# Patient Record
Sex: Male | Born: 1940 | Race: White | Hispanic: No | Marital: Married | Smoking: Former smoker
Health system: Southern US, Community
[De-identification: ages and names within clinical notes are randomized; demographics above are authoritative.]

## PROBLEM LIST (undated history)

## (undated) DIAGNOSIS — M19041 Primary osteoarthritis, right hand: Secondary | ICD-10-CM

## (undated) DIAGNOSIS — I2510 Atherosclerotic heart disease of native coronary artery without angina pectoris: Secondary | ICD-10-CM

## (undated) DIAGNOSIS — C4490 Unspecified malignant neoplasm of skin, unspecified: Secondary | ICD-10-CM

## (undated) DIAGNOSIS — J189 Pneumonia, unspecified organism: Secondary | ICD-10-CM

## (undated) DIAGNOSIS — M109 Gout, unspecified: Secondary | ICD-10-CM

## (undated) DIAGNOSIS — E785 Hyperlipidemia, unspecified: Secondary | ICD-10-CM

## (undated) DIAGNOSIS — I10 Essential (primary) hypertension: Secondary | ICD-10-CM

## (undated) DIAGNOSIS — I219 Acute myocardial infarction, unspecified: Secondary | ICD-10-CM

## (undated) DIAGNOSIS — M19042 Primary osteoarthritis, left hand: Secondary | ICD-10-CM

## (undated) HISTORY — PX: HERNIA REPAIR: SHX51

## (undated) HISTORY — DX: Essential (primary) hypertension: I10

## (undated) HISTORY — PX: TONSILLECTOMY: SUR1361

## (undated) HISTORY — PX: COLONOSCOPY: SHX174

## (undated) HISTORY — DX: Hyperlipidemia, unspecified: E78.5

## (undated) HISTORY — DX: Atherosclerotic heart disease of native coronary artery without angina pectoris: I25.10

## (undated) HISTORY — PX: CARDIAC CATHETERIZATION: SHX172

## (undated) HISTORY — PX: APPENDECTOMY: SHX54

---

## 1983-10-11 DIAGNOSIS — J189 Pneumonia, unspecified organism: Secondary | ICD-10-CM

## 1983-10-11 HISTORY — DX: Pneumonia, unspecified organism: J18.9

## 2007-04-10 DIAGNOSIS — I219 Acute myocardial infarction, unspecified: Secondary | ICD-10-CM

## 2007-04-10 HISTORY — PX: CORONARY ARTERY BYPASS GRAFT: SHX141

## 2007-04-10 HISTORY — PX: LAPAROSCOPIC INCISIONAL / UMBILICAL / VENTRAL HERNIA REPAIR: SUR789

## 2007-04-10 HISTORY — DX: Acute myocardial infarction, unspecified: I21.9

## 2007-04-16 ENCOUNTER — Ambulatory Visit: Payer: Self-pay | Admitting: Cardiology

## 2007-04-16 ENCOUNTER — Inpatient Hospital Stay (HOSPITAL_COMMUNITY): Admission: AD | Admit: 2007-04-16 | Discharge: 2007-04-26 | Payer: Self-pay | Admitting: Cardiology

## 2007-04-16 ENCOUNTER — Ambulatory Visit: Payer: Self-pay | Admitting: Cardiovascular Disease

## 2007-04-17 ENCOUNTER — Encounter: Payer: Self-pay | Admitting: Cardiovascular Disease

## 2007-04-17 ENCOUNTER — Ambulatory Visit: Payer: Self-pay | Admitting: Thoracic Surgery (Cardiothoracic Vascular Surgery)

## 2007-04-18 ENCOUNTER — Ambulatory Visit: Payer: Self-pay | Admitting: Vascular Surgery

## 2007-04-18 ENCOUNTER — Encounter: Payer: Self-pay | Admitting: Cardiology

## 2007-04-18 ENCOUNTER — Encounter: Payer: Self-pay | Admitting: Thoracic Surgery (Cardiothoracic Vascular Surgery)

## 2007-04-20 ENCOUNTER — Encounter: Payer: Self-pay | Admitting: Thoracic Surgery (Cardiothoracic Vascular Surgery)

## 2007-05-14 ENCOUNTER — Encounter
Admission: RE | Admit: 2007-05-14 | Discharge: 2007-05-14 | Payer: Self-pay | Admitting: Thoracic Surgery (Cardiothoracic Vascular Surgery)

## 2007-05-16 ENCOUNTER — Ambulatory Visit: Payer: Self-pay | Admitting: Thoracic Surgery (Cardiothoracic Vascular Surgery)

## 2007-05-25 ENCOUNTER — Ambulatory Visit: Payer: Self-pay | Admitting: Thoracic Surgery (Cardiothoracic Vascular Surgery)

## 2007-07-18 ENCOUNTER — Ambulatory Visit: Payer: Self-pay | Admitting: Cardiovascular Disease

## 2007-08-17 ENCOUNTER — Ambulatory Visit: Payer: Self-pay | Admitting: Cardiovascular Disease

## 2008-01-21 ENCOUNTER — Ambulatory Visit: Payer: Self-pay | Admitting: Cardiovascular Disease

## 2008-01-23 ENCOUNTER — Encounter: Payer: Self-pay | Admitting: Cardiovascular Disease

## 2008-01-23 ENCOUNTER — Ambulatory Visit (HOSPITAL_COMMUNITY): Admission: RE | Admit: 2008-01-23 | Discharge: 2008-01-23 | Payer: Self-pay | Admitting: Cardiovascular Disease

## 2008-01-23 ENCOUNTER — Ambulatory Visit: Payer: Self-pay | Admitting: Cardiovascular Disease

## 2008-04-07 IMAGING — CR DG CHEST 1V PORT
1 series · 1 of 1 positions shown · non-contrast
Comparison: Preoperative film from 04/18/2007

CLINICAL DATA: Chest pain and shortness of breath. CABG.

[view not recorded]
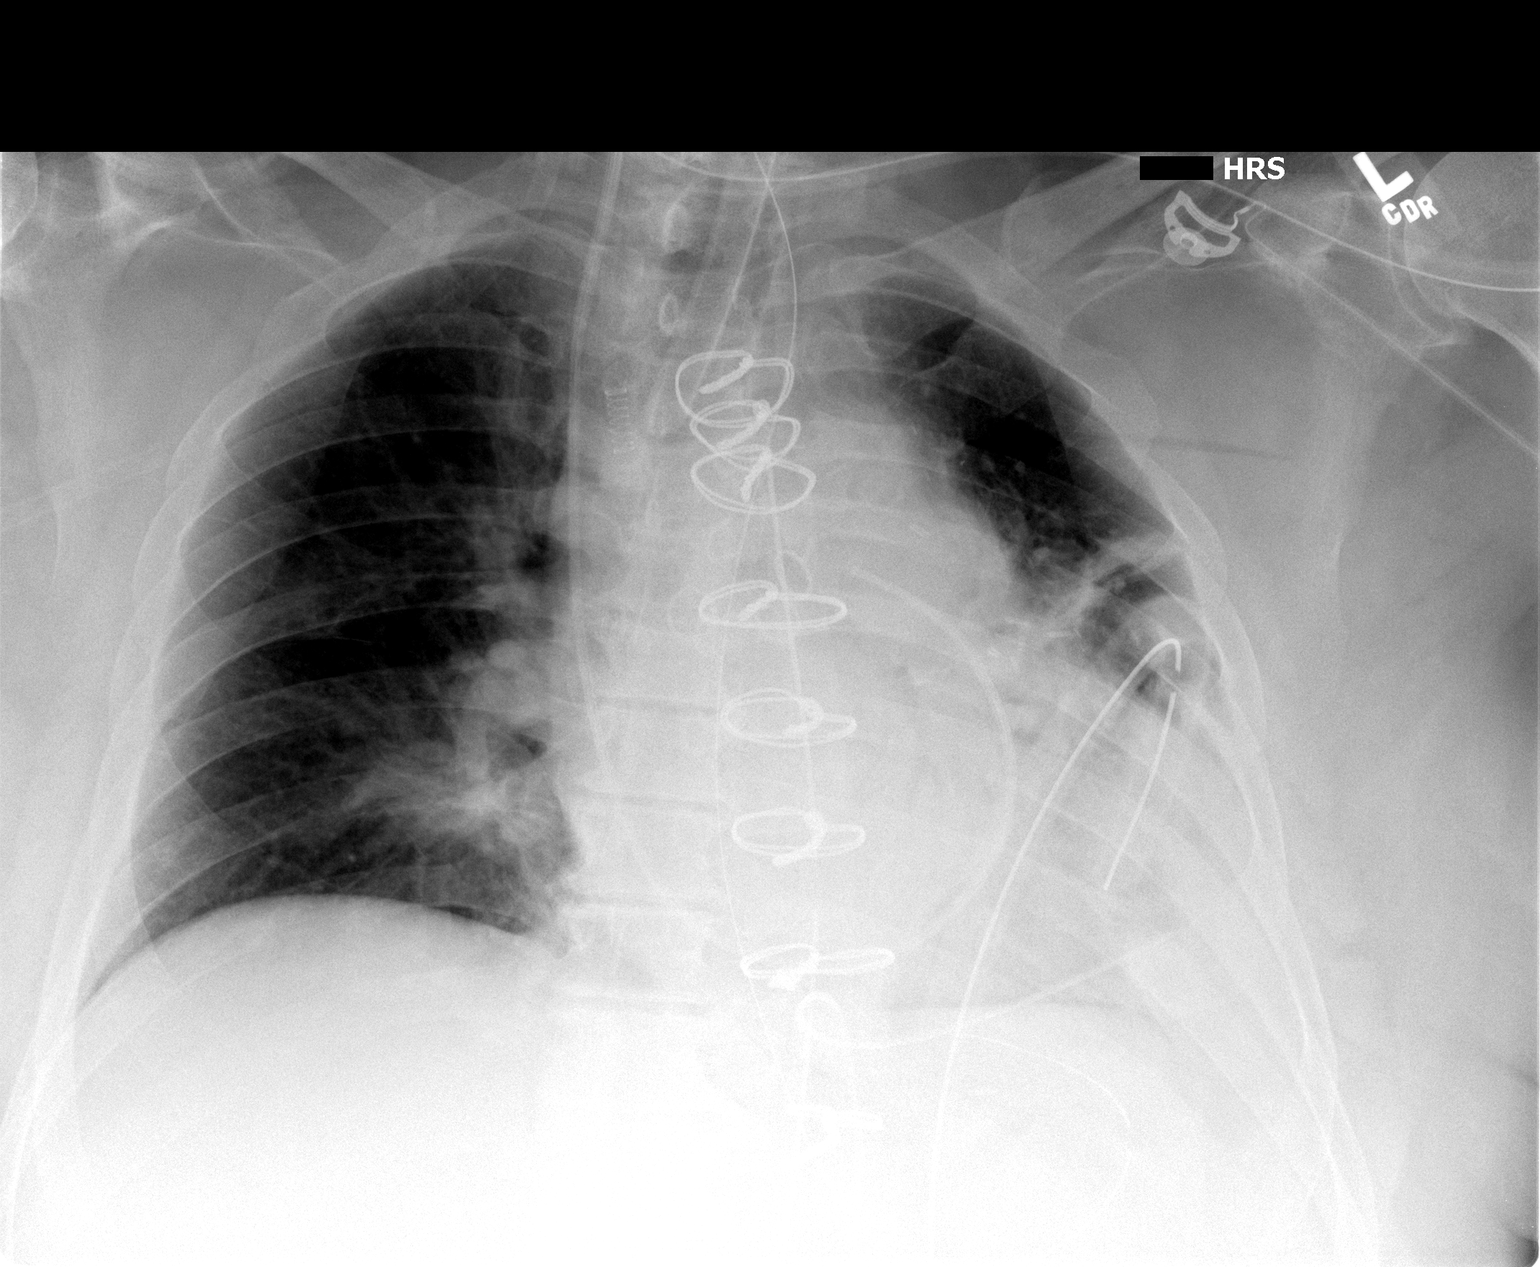

[1 of 1 positions shown; findings below may reference images not displayed]

PORTABLE CHEST - 1 VIEW:

2444 hours. Endotracheal tube tip is 3.0 cm above the base of the carina. Right
IJ pulmonary artery catheter tip is in the right main pulmonary artery. Left
chest tube noted without evidence for pneumothorax. There are 2 mediastinal /
pericardial drains. An NG tube passes into the stomach with the tip overlying
the fundus.

Cardiopericardial silhouette is enlarged. The cardiomediastinal contours are
obscured, raising the question mediastinal hematoma. There is vascular
congestion with bibasilar atelectasis, left greater than right.
IMPRESSION: Support apparatus as described.

Obscuration of cardiomediastinal contours raises concern for mediastinal
hematoma. Please correlate clinically and serial chest x-rays would be helpful
to exclude progression.

Vascular congestion with bibasilar atelectasis, left greater than right.

## 2008-04-08 IMAGING — CR DG CHEST 1V PORT
1 series · 1 of 1 positions shown · non-contrast
Comparison: 04/20/2007

CLINICAL DATA: Chest pain and shortness of breath

[view not recorded]
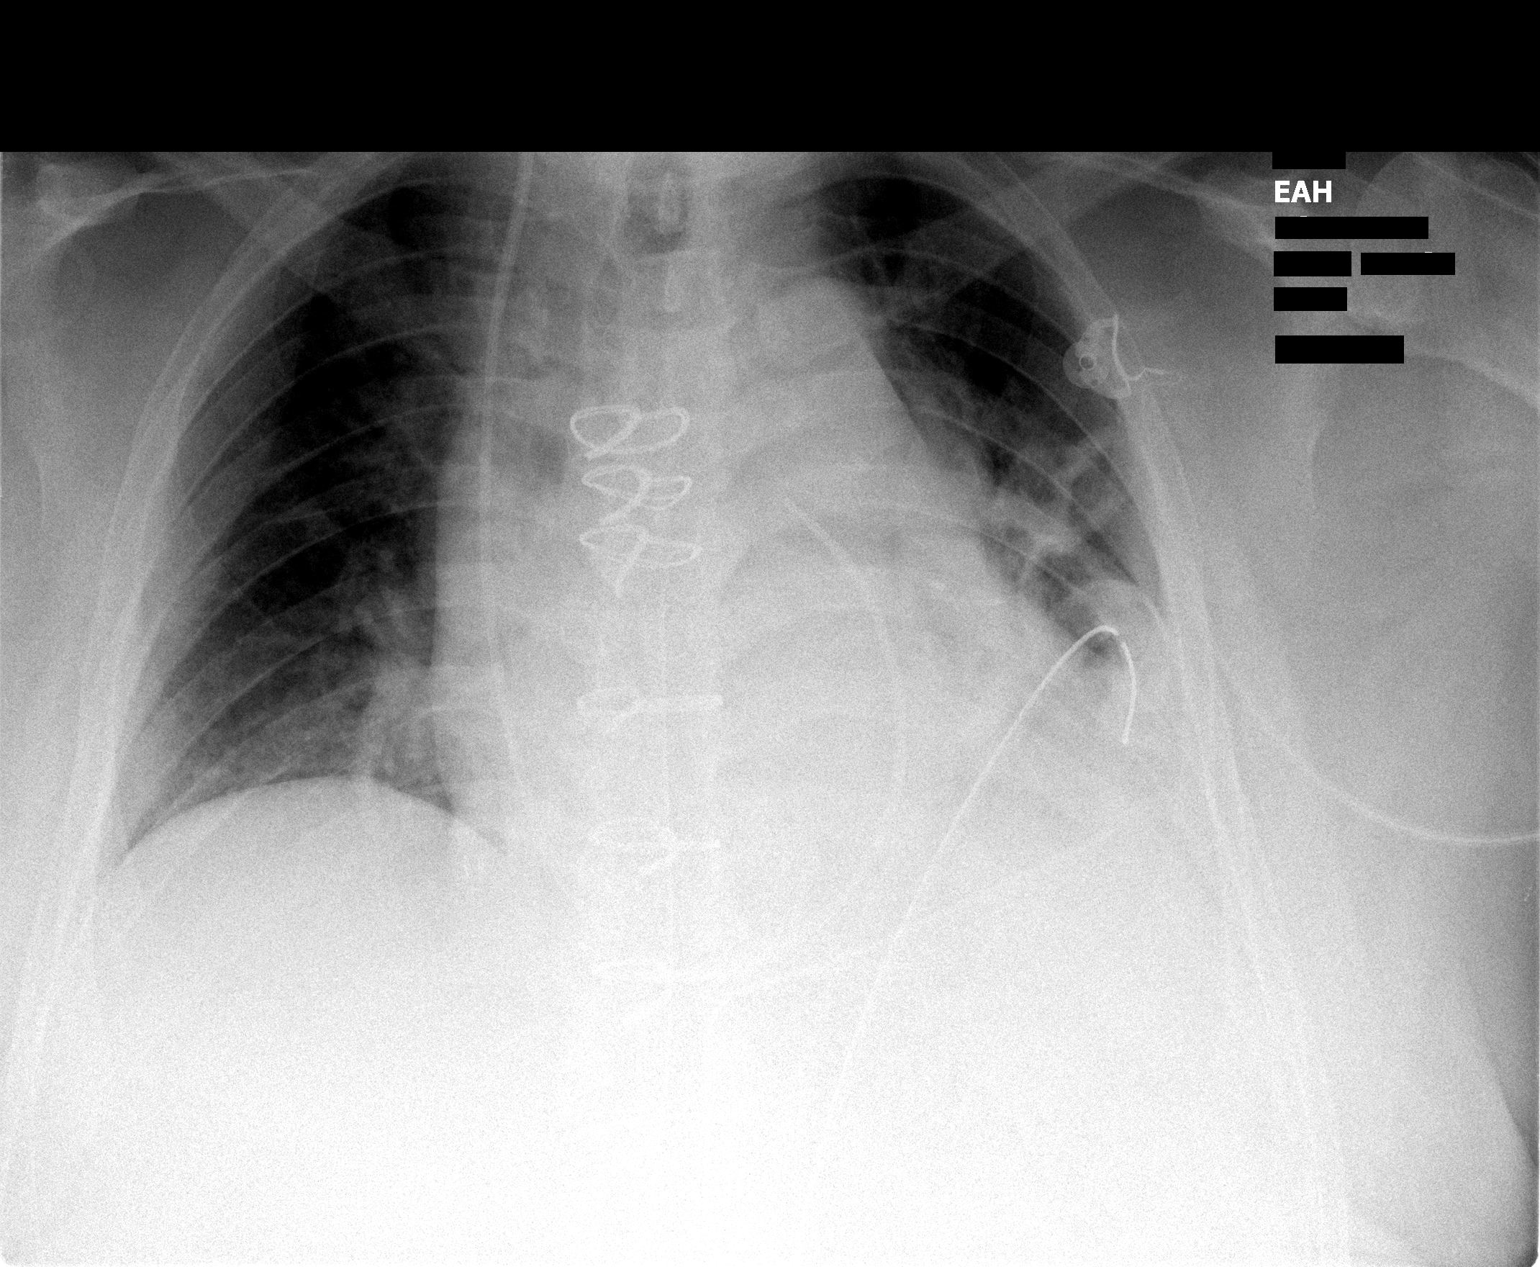

[1 of 1 positions shown; findings below may reference images not displayed]

PORTABLE CHEST - 1 VIEW:

6256 hours. Interval extubation and removal of NG tube. Lung volumes are low
with slight interval increase in left base atelectasis. There is a probable
small left pleural effusion. Left chest tube again noted without evidence for
left pneumothorax. The right IJ pulmonary artery catheter tip is in the main
pulmonary outflow tract to the right main pulmonary artery.  The 2 mediastinal /
pericardial drains are again noted.

Stable widening of the mediastinum with obscuration of mediastinal contours.
IMPRESSION: Stable widening of the mediastinum.

Interval extubation and removal of NG tube.

Interval increase in left base atelectasis.

## 2008-04-09 IMAGING — CR DG CHEST 1V PORT
1 series · 1 of 1 positions shown · non-contrast
Comparison: 04/21/2007
 Portable exam at [DATE] a.m.

CLINICAL DATA: 66-year-old with chest pain, shortness of breath and elevated troponin.   
 PORTABLE CHEST - 04/22/2007:

[view not recorded]
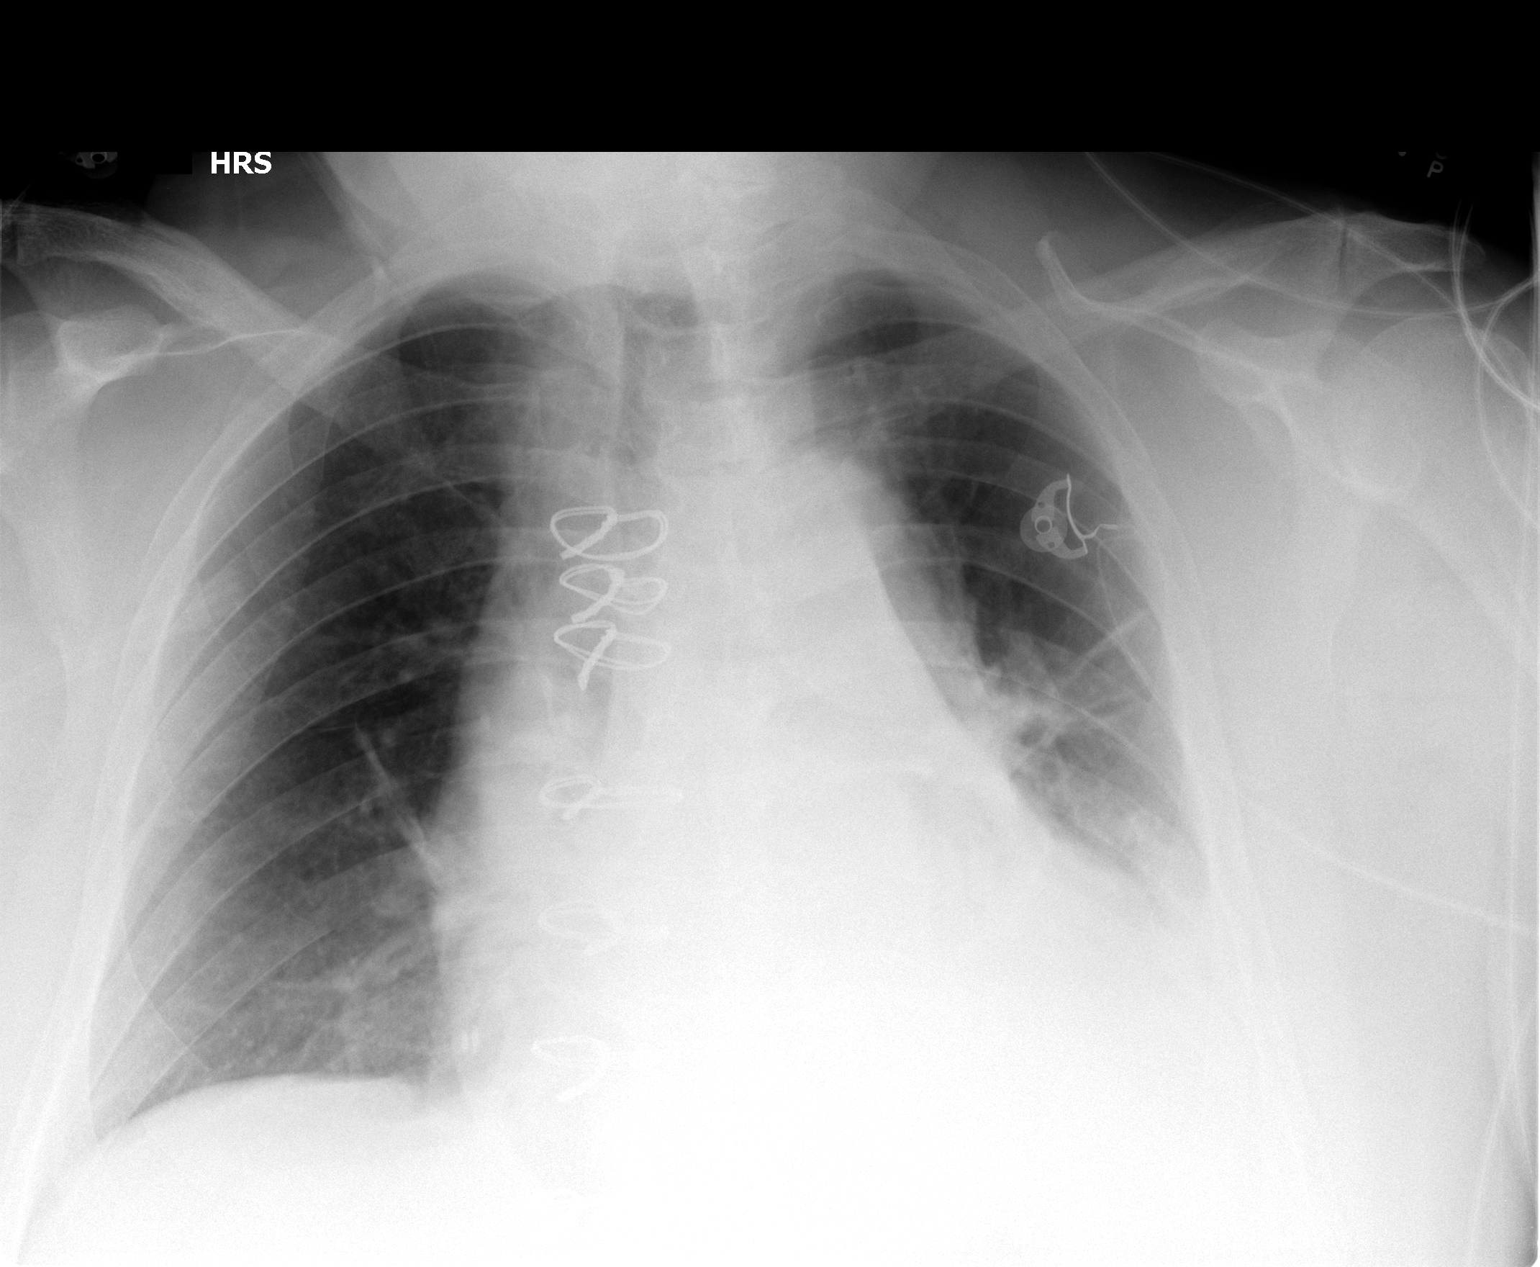

[1 of 1 positions shown; findings below may reference images not displayed]

FINDINGS: Patient has had median sternotomy.  Right IJ Swan-Ganz catheter and left chest tubes have been removed.  There is no evidence for pneumothorax.  There is dense opacification of the left lung base and bilateral perihilar atelectasis.
IMPRESSION: 1.  No evidence for pneumothorax following chest tube removal. 
 2.  Cardiomegaly and bilateral atelectasis.

## 2008-04-10 IMAGING — CR DG CHEST 2V
2 series · 2 of 2 positions shown · non-contrast
Comparison: 04/22/07.

CLINICAL DATA: Chest pain and shortness of breath.
 CHEST - 2 VIEW:

[view not recorded (1 of 2)]
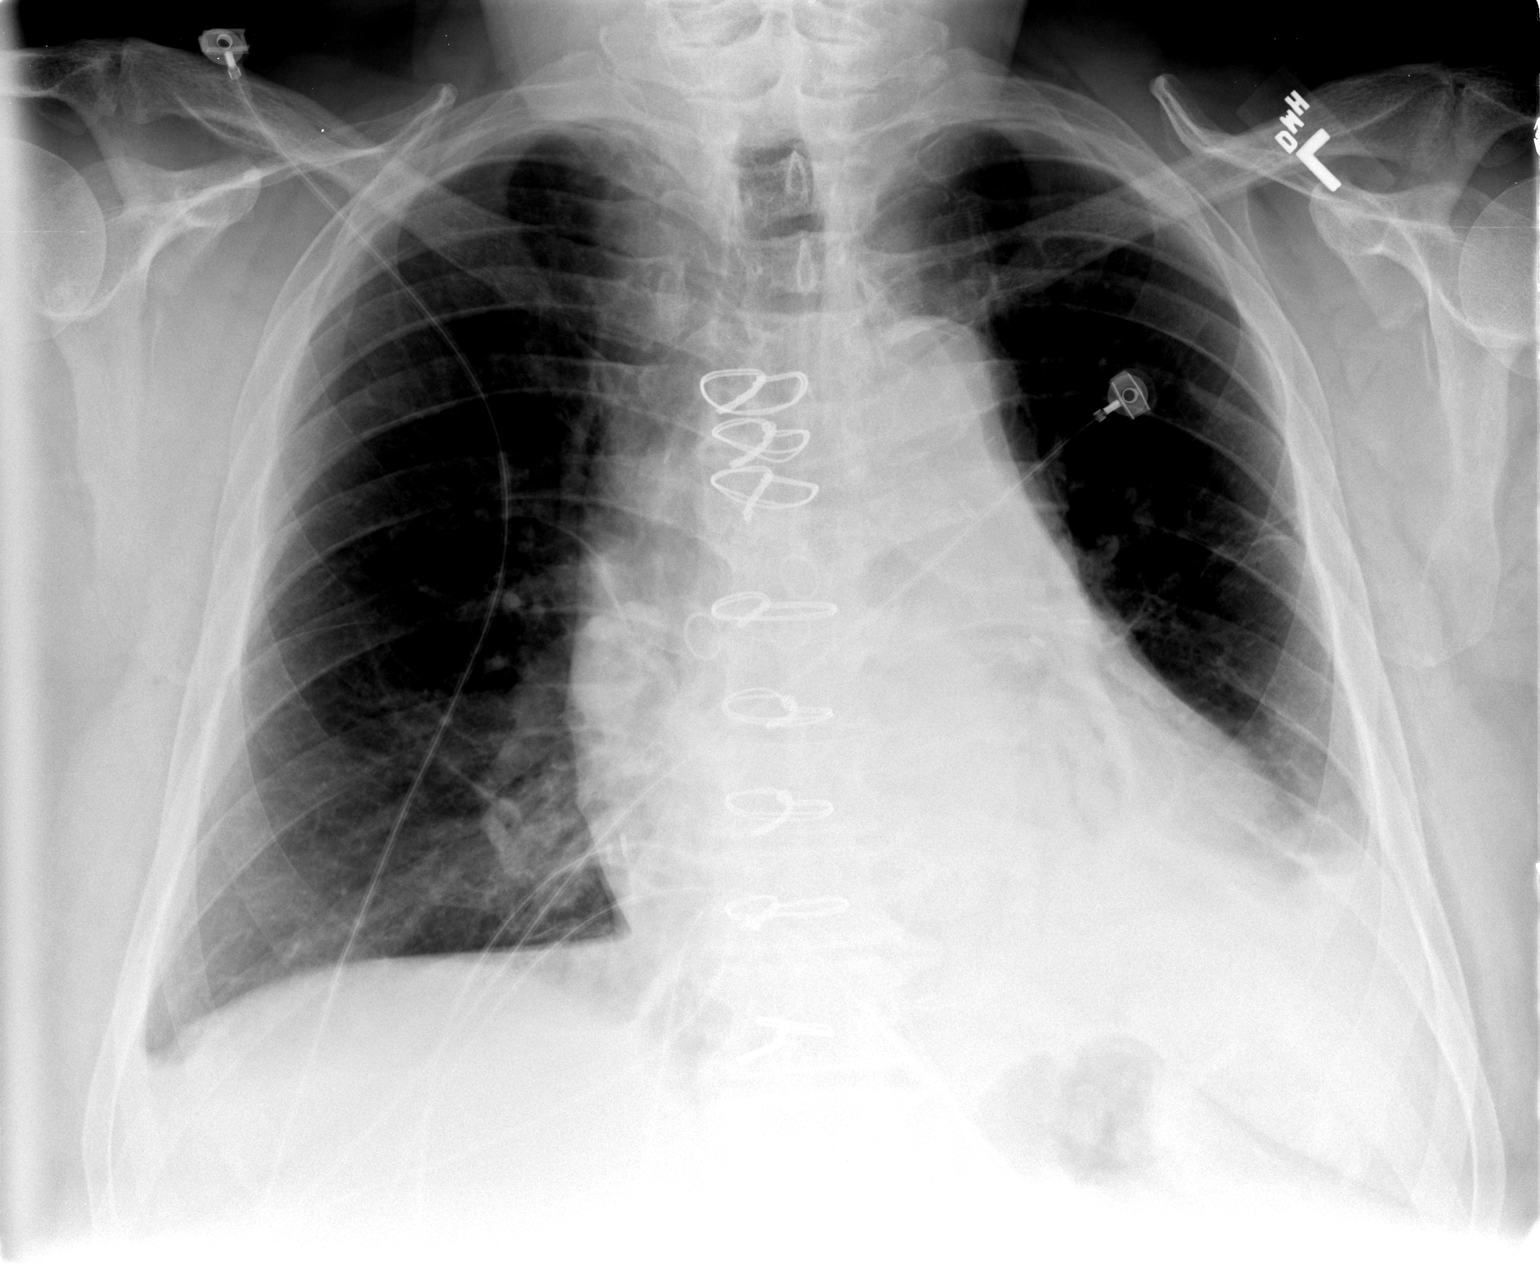

[view not recorded (2 of 2)]
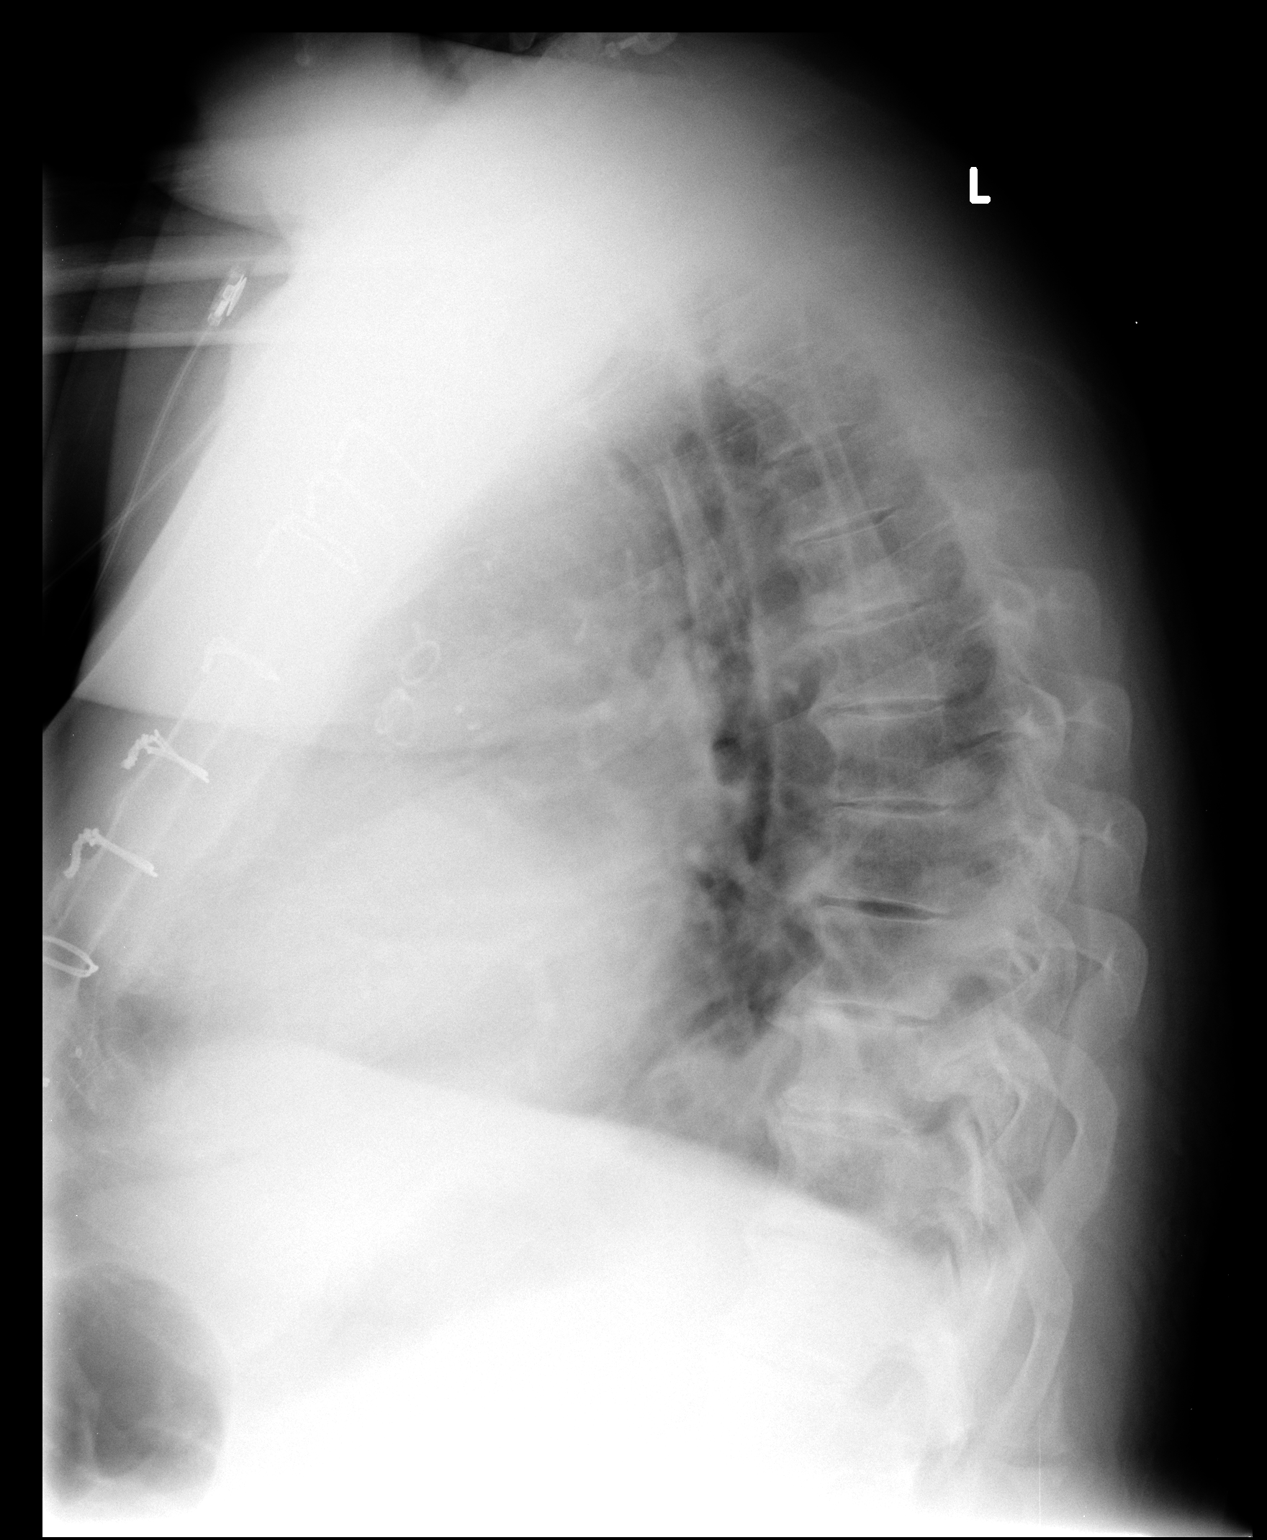

[2 of 2 positions shown; findings below may reference images not displayed]

FINDINGS: Two views of the chest demonstrate cardiac enlargement status post CABG procedure and median sternotomy wires.  There is evidence of bilateral pleural effusions and consolidation in the left lung base, concerning for a focal area of atelectasis versus infection.  No significant pulmonary edema.  The trachea is midline.
IMPRESSION: 1.  Left lower lobe consolidation as described.  Differential includes atelectasis versus pneumonia.
 2.  Small pleural effusions.
 3.  Postoperative changes with cardiomegaly.

## 2009-11-30 ENCOUNTER — Telehealth (INDEPENDENT_AMBULATORY_CARE_PROVIDER_SITE_OTHER): Payer: Self-pay

## 2010-10-31 ENCOUNTER — Encounter: Payer: Self-pay | Admitting: Thoracic Surgery (Cardiothoracic Vascular Surgery)

## 2010-11-10 NOTE — Progress Notes (Signed)
Summary: Pt. overdue for f/u   Phone Note Outgoing Call   Call placed by: Larita Fife Via LPN,  November 30, 2009 4:27 PM Summary of Call: Patient has not been seen in this office since 01-21-08. We received RX refill request for Diovan but Diovan is not listed in his med list. I left message for patient to call this office.  Follow-up for Phone Call        no answer, left message. Follow-up by: Larita Fife Via LPN,  December 02, 2009 4:27 PM  Additional Follow-up for Phone Call Additional follow up Details #1::        Left message with phar. to have pt. call us.

## 2011-02-22 NOTE — Assessment & Plan Note (Signed)
OFFICE VISIT   [name], [name]  DOB:  1941-03-08                                        May 16, 2007  CHART #:  [id number]   HISTORY OF PRESENT ILLNESS:  [name] returns for a routine follow-up  status post coronary artery bypass grafting x 4 on 04/20/07.  His early  postoperative recovery in the hospital was notable for some  postoperative atrial fibrillation.  He was discharged home on amiodarone  in normal sinus rhythm.  Since hospital discharge, [name] has  continued to recover without significant problem.  We did renew his  Lasix prescription over the telephone due to persistent bilateral lower  extremity edema.  He has not yet been seen in follow-up by Dr. Eden Emms  our colleagues at the [facility] office.  He returns to our  office for routine follow-up today.  [name] reports that overall he is  doing very well.  He has minimal residual shortness in his chest and he  is no longer taking any pain medication.  He denies any shortness of  breath.  He denies any exertional chest discomfort worrisome for angina  pectoris.  His appetite  is good and he states that he is maintaining a  heart healthy diet and doing well so far.  He is sleeping well at night.  He does still have bilateral lower extremity edema.  He has been  ambulating reasonably well, and he states that he can walk up to a 1/2  mile at a time at this point.  He has already started driving an  automobile.  He has not yet started the cardiac rehab program.  The  remainder of his review of systems is unrevealing, although [name]  does report that he stopped taking amiodarone on his own 2 weeks ago  because of concerns regarding possible side effects he learned about  while reading on the internet.  His medications otherwise remain  unchanged from the time of hospital discharge.   PHYSICAL EXAMINATION:  Notable for a well-appearing morbidly obese male  with blood pressure 150/92,  pulse 90, oxygen saturation 99% on room air.  HEENT:  Exam is unrevealing.  The chest sternal incision is healing  nicely and the sternum is stable on palpation.  Breath sounds are clear  to auscultation with slightly diminished breath sounds at the left lung  base.  No wheezes or rhonchi are noted.  CARDIOVASCULAR:  Includes regular rate and rhythm.  No murmurs, rubs or  gallops are noted.  ABDOMEN:  Soft, nontender.  EXTREMITIES:  Warm and well-perfused.  The small incision from bilateral  endoscopic vein harvest are healing nicely.  There is moderate bilateral  lower extremity edema at the ankle.  The remainder of his physical exam  is unrevealing.   DIAGNOSTIC TESTS:  Chest x-ray obtained 05/14/07 at the [facility] is reviewed.  This demonstrates clear lung fields  bilaterally with slight persistent elevation of the left hemidiaphragm  with a small left pleural effusion and mild left lower lobe atelectasis.  This appears somewhat improved in comparison with the last x-ray prior  to hospital discharge.  All the sternal wires appear intact.  No other  abnormalities are noted.   IMPRESSION:  Satisfactory progress following recent coronary artery  bypass grafting.  Overall [name]  appears to be doing well.  His blood  pressure has continued to rise following hospital discharge.  He still  has bilateral lower extremity edema and he has a small left pleural  effusion.  Overall he feels well and his activity level has come along  quite nicely.  He stopped taking amiodarone on his own spontaneously two  weeks ago.  He has not had any tachy palpitations.   PLAN:  I have given [name] a prescription for  Lasix 40 mg daily,  potassium 20 mEq daily, and I have also added lisinopril 10 mg daily.  He will continue metoprolol, aspirin and Lipitor as he had been taking  previously.  He has been instructed to contact Dr. Fabio Bering office to  schedule an appointment within the  next few weeks.  I have encouraged  him to continue to gradually increase his physical activity as tolerated  with his only limitation at this point remaining that he refrain from  heavy lifting or strenuous use of his arms or shoulders for at least  another 2 months.  I have encouraged him to go ahead and get started in  the cardiac rehab program.  All of his questions have been addressed.  In the future [name] will call and return to see [place] here at [facility] as necessary.   Salvatore Decent. [name] [name], M.D.  Electronically Signed   CHO/MEDQ  D:  05/16/2007  T:  05/17/2007  Job:  [id number]   cc:   Noralyn Pick. Eden Emms, MD, [facility]  Veverly Fells. Excell Seltzer, MD  [facility] Sherril Croon

## 2011-02-22 NOTE — Op Note (Signed)
[name], [name]                 ACCOUNT NO.:  [id number]   MEDICAL RECORD NO.:  [id number]          PATIENT TYPE:  INP   LOCATION:  2399                         FACILITY:  [facility]   PHYSICIAN:  Salvatore Decent. Cornelius Moras, M.D. DATE OF BIRTH:  12-08-1940   DATE OF PROCEDURE:  04/20/2007  DATE OF DISCHARGE:                               OPERATIVE REPORT   PREOPERATIVE DIAGNOSIS:  Severe three vessel coronary artery disease.   POSTOPERATIVE DIAGNOSIS:  Severe three vessel coronary artery disease.   PROCEDURE PERFORMED:  Median sternotomy for coronary artery bypass  grafting times four (left internal mammary artery to distal left  anterior descending coronary artery, saphenous vein graft to first  diagonal branch, saphenous vein graft to ramus intermediate branch,  saphenous vein graft to right posterolateral branch, endoscopic  saphenous vein harvest from bilateral thighs).   SURGEON:  Schuyler Amor. Cornelius Moras, M.D.   ASSISTANT:  Mr. Hulan Fess   ANESTHESIA:  General.   BRIEF CLINICAL NOTE:  The patient is a 70 year old morbidly obese white  male with no previous history of coronary artery disease but risk  factors notable for hypertension, newly discovered hyperlipidemia, and  morbid obesity.  The patient describes a several year history of  intermittent burning epigastric pain that is attributed to heartburn.  He had a particularly prolonged episode of pain two weeks previously,  and on the morning of 04/16/07 the patient was awoke with acute onset of  shortness of breath followed by burning epigastric chest pain.  He  presented to the emergency room at [facility] where he had  borderline positive cardiac enzymes consistent with acute coronary  syndrome.  He was notably hypertensive at that time as well.  He was  stabilized medically and transferred to [facility]  where he underwent a cardiac catheterization.  He was found to have  severe three-vessel coronary  artery disease with mild left ventricular  dysfunction.  A full consultation has been dictated previously.  The  patient has been counseled at length regarding the indications, risks,  and potential benefits of surgery.  Alternative treatment strategies  have been discussed.  He understands and accepts all associated risks of  surgery and desires to proceed as described.   OPERATIVE FINDINGS:  1. Mild left ventricular dysfunction with ejection fraction estimated      at 50%.  2. Moderate left ventricular hypertrophy and biventricular dilatation.  3. Good quality left internal mammary artery and saphenous vein      conduit for grafting.  4. Fair to good quality target vessels for grafting.   OPERATIVE NOTE IN DETAIL:  The patient was brought to the operating room  on the above-mentioned date and central monitoring was established by  the anesthesia service under the care and direction of Dr. Judie Petit.  Specifically, a Swan-Ganz catheter was placed through the  right internal jugular approach.  A radial arterial line was placed.  Intravenous antibiotics were administered.  Following induction with  general endotracheal anesthesia, a Foley catheter was placed.  The  patient's chest, abdomen, both groins, and both  lower extremities were  prepared and draped in a sterile manner.  Baseline transesophageal  echocardiogram was performed by Dr. Randa Evens.  This demonstrates mild  left ventricular dysfunction with an ejection fraction estimated at 50%.  There was mild mitral regurgitation.  No other abnormalities were noted.   A median sternotomy incision was performed and the left internal mammary  artery was dissected from the chest wall and prepared for bypass  grafting.  The left internal mammary artery is a good-quality conduit.  Simultaneously a saphenous vein was obtained from the patient's right  thigh using endoscopic vein harvest technique.  At the level of the knee  the vein  was noted to have significant varicosities so further  dissection below the knee was aborted.  An additional segment of  saphenous vein was obtained from the patient's left thigh using  endoscopic vein harvest technique.  The saphenous vein which was  utilized for grafting was felt to be good quality.  The patient was  heparinized systemically.  The left internal mammary artery was  transected distally and noted to have excellent flow.   The pericardium was opened.  The ascending aorta is mildly diseased with  atherosclerosis.  The ascending aorta and the right atrium were  cannulated for cardiopulmonary bypass.  Adequate heparinization was  verified.  Cardiopulmonary bypass was begun and the surface of the heart  was inspected.  The heart was quite large with biventricular chamber  enlargement and mild to moderate left ventricular hypertrophy.  Distal  target vessels were selected for coronary bypass grafting.  The  posterolateral branch off the distal right coronary artery is the  dominant and largest branch of the distal right coronary artery system.  The posterior descending coronary artery is quite small in size.  A  temperature probe is placed in the left ventricular septum.  A  cardioplegic catheter was placed in the ascending aorta.   The patient was allowed to cool passively to 32 degrees systemic  temperature.  The aortic crossclamp was applied and cold blood  cardioplegia was administered in an antegrade fashion through the aortic  root.  Iced saline slush was applied for topical hypothermia.  The  initial cardioplegic arrest, myocardial cooling, are felt to be  excellent.  Repeat doses of cardioplegia were administered  intermittently throughout the crossclamp portion of the operation and  antegrade through the aortic root, and antegrade down subsequently  placed vein graft to maintain left ventricular septal temperature below  15 degrees centigrade.   The following distal  coronary anastomoses were performed:  1. The posterolateral branch off the distal right coronary artery is      grafted with a saphenous vein graft in an end-to-side fashion.      This vessel measures 1.3 mm in diameter and is a fair to good      quality target vessel for grafting.  It is the only graftable      target in the distal right coronary artery circulation.  2. The ramus intermediate branch is grafted with a saphenous vein      graft in an end-to-side fashion.  This vessel measures 2.2 mm in      diameter and is a good quality target vessel for grafting.  3. The diagonal branch off the left anterior descending coronary      artery is grafted with a saphenous vein graft in an end-to-side      fashion.  This vessel measures 1.0 mm in diameter and is  a fair      quality target vessel for grafting.  4. The distal left anterior descending coronary artery is grafted with      a left internal mammary artery in an end-to-side fashion.  This      vessel measures 1.6 mm in diameter and is a good quality target      vessel for grafting.  It is intramyocardial.   All three proximal saphenous vein anastomoses were performed directly to  the ascending aorta prior to removal of the aortic crossclamp.  The left  ventricular septal temperature rises rapidly with reperfusion of the  left internal mammary artery.  The aortic crossclamp was removed after  total crossclamp time of 93 minutes.  The heart began to beat  spontaneously without need for cardioversion.  All proximal and distal  coronary anastomoses are inspected for hemostasis and appropriate graft  orientation.  Epicardial pacing wires were fixed to the right  ventricular outflow tract into the right atrial appendage.  The patient  is rewarmed to 37 degrees centigrade temperature.  The patient was  weaned from cardiopulmonary bypass without difficulty.  The patient's  rhythm at separation from bypass is sinus rhythm with a long first-   degree AV block.  AV sequential pacing is employed.  Total  cardiopulmonary bypass time for the operation is 113 minutes.  No  anatropic support is required.   A follow-up transesophageal echocardiogram was performed by Dr. Michelle Piper  after separation from bypass and demonstrates preserved left ventricular  function with mild mitral regurgitation.  No other significant  abnormalities were noted.   The venous and arterial cannulae are removed uneventfully.  Protamine  was administered to reverse the anticoagulation.  The mediastinum and  left chest are irrigated with saline solution containing vancomycin.  Meticulous surgical hemostasis was ascertained.  The mediastinum and the  left chest were drained using three chest tubes exited through separate  stab incisions inferiorly.  The pericardium and soft tissues anterior to  the aorta are reapproximated loosely.  The sternum was closed with  double-strength sternal wire.  The soft tissues anterior to the sternum  were closed in multiple layers and the skin was closed with a running  subcuticular skin closure.   The patient tolerated the procedure well and was transported to the  surgical intensive care unit in stable condition.  There were no  intraoperative complications.  All sponge, instrument, and needle counts  were verified correct at completion of the operation.  No blood products  were administered.      Salvatore Decent. Cornelius Moras, M.D.  Electronically Signed     CHO/MEDQ  D:  04/20/2007  T:  04/22/2007  Job:  [id number]   cc:   Salvatore Decent. Cornelius Moras, M.D.  Noralyn Pick. Eden Emms, MD, [facility]  Veverly Fells. Excell Seltzer, MD  [facility] Sherril Croon

## 2011-02-22 NOTE — Assessment & Plan Note (Signed)
[facility]                       [place] [facility]ARDIOLOG[facility] OFFICE NOTE   NAME:[name], [name]                        MRN:          [id number]  DATE:08/17/2007                            DOB:          04/17/1941    Adrion returns today for followup.   He has had previous coronary bypass surgery with a non-ST segment  elevation MI.   He is morbidly obese.  The last time I saw him, we cleared him for  ventral hernia repair.  He had this by Dr. Gabriel Cirri.  There were no  untoward events.   The patient also has a history of PAF.  He has been maintaining sinus  rhythm.  The last time I saw him, we had to treat his blood pressure  more aggressively, as well as his lower extremity edema.   He has not had any significant chest pain, PND, orthopnea, or  palpitations.   REVIEW OF SYSTEMS:  Otherwise, negative.   CURRENT MEDICATIONS:  Include:  1. Lopressor 100 b.i.d.  2. Hyzaar 100/12.5.  3. Aspirin a day.  4. Folic acid 1 mg a day.  5. Simvastatin 40 a day.  6. Fish oil.   We will add Lasix 20 a day, and potassium 20 a day.  This was called in  to [facility].   EXAMINATION:  Remarkable for a morbidly obese white male in no distress.  Weight is still 321.  Blood pressure 140/93.  Pulse is 71 and regular.  Afebrile.  HEENT:  Unremarkable,.  LUNGS:  Clear with good diaphragmatic motion.  NECK:  Supple.  No JVP elevation.  No lymphadenopathy or thyromegaly.  S1 and S2 with distant heart sounds.  PMI not palpable.  ABDOMEN:  Protuberant.  Still has a small ventral hernia an everted  umbilicus.  Some scarring and ecchymosis from his surgery.  Distal pulses are intact.  He has +1 to 2 lower extremity edema  bilaterally.  NEUROLOGIC:  Nonfocal.  No musculoskeletal weakness.  SKIN:  Warm and dry.   IMPRESSION:  1. History of coronary artery disease.  Currently stable.  Continue      aspirin and beta blocker.  2. Lower extremity edema.  Add  Lasix and potassium in addition to his      diuretic that is in the Hyzaar.  Decrease salt intake.  Elevate      legs at the end of the day.  3. Hypertension.  Currently borderline controlled.  Should be helped      with the addition of Lasix.  Follow up in 3 months.  4. Hypercholesterolemia in the setting of previous bypass.  Continue      Simvastatin 40 a day.  Lipid and liver profile in 6 months.  5. Recent abdominal surgery.  I told Aadil that I thought his hernia      would return and worsen if he does not try to lose weight.      Hopefully, he will take this to heart.  He is not really a great      candidate for bariatric surgery.   I  will see him back in 3 months.     Noralyn Pick. Eden Emms, MD, [facility]  Electronically Signed    PCN/MedQ  DD: 08/17/2007  DT: 08/17/2007  Job #: [id number]

## 2011-02-22 NOTE — H&P (Signed)
[name], [name] NO.:  [id number]   MEDICAL RECORD NO.:  [id number]          PATIENT TYPE:  INP   LOCATION:  3731                         FACILITY:  [facility]   PHYSICIAN:  Rollene Rotunda, MD, FACCDATE OF BIRTH:  July 20, 1941   DATE OF ADMISSION:  04/16/2007  DATE OF DISCHARGE:                              HISTORY & PHYSICAL   PRIMARY PHYSICIANS:  Primary cardiologist will be new, Dr. Charlton Haws.  Primary care physician is Dr. Sherril Croon in [place].   HISTORY OF PRESENT ILLNESS:  This is a 70 year old, morbidly obese,  Caucasian male with no prior history of coronary artery disease,  hypertension or diabetes who was transferred from [facility] at  the request of Dr. Much in the ER secondary to positive cardiac enzyme  of a troponin of 0.18.  The patient awoke around 2 AM, which he does  pretty regularly.  He got up and walked to the living room and began to  play solitaire on his laptop in order to help him get sleepy again.  During the time he was sitting in the chair, he began to feel a fullness  in his lungs and a rattling with his breathing and some mild crackling.  He got up and walked about and the dyspnea became worse and he felt some  fullness in his abdomen and was unable to take a deep breath.  This  occurred very suddenly with no associated chest pain.  The patient began  to have some mild heartburn-like symptoms, but he states that he  normally has heartburn on and off throughout the day for many, many  years, sometimes worsening with bending over.  The patient took a shower  and shaved and during shaving, the dyspnea became worse, so he decided  to go to the emergency room at [facility].  The patient was en route to  the emergency room when his breathing status remained difficult.  He  called 911 to alert them of his status and the 911 operator stayed on  the phone with him until he arrived at the hospital.  He stated that if  he began to have  dizziness or worsening dyspnea, he was going to pull  over and let 911 take him to the hospital.  He was able to arrive to the  emergency room safely.  When he arrived, he was given 40 mg of Lasix IV,  nitroglycerin sublingual times three, and morphine.  The patient did  have relief of symptoms to include the heartburn and has diuresed  approximately 2000 mL.  The patient's troponin was calculated and was  found to be 0.18.  Chest x-ray was completed and by report from Dr.  Much, he was found to have some mild pulmonary edema.  Also on arrival  to the emergency room, the patient's blood pressure was found to be  200/115.  After being given the nitroglycerin, Lasix and aspirin, the  patient's blood pressure came down to 147/93.  His heart rate remained  in the nineties.  EKG did show some anterolateral T-wave inversion noted  without acute  ST/T-wave elevation.  The patient also was found to have  inversion in lead I as well.   REVIEW OF SYSTEMS:  Positive for heartburn, sudden onset of shortness of  breath, dyspnea on exertion, otherwise negative.   PAST MEDICAL HISTORY:  1. Hypertension.  The patient is not on any medications.  2. Remote history of gout and remote history of pneumonia.   PAST SURGICAL HISTORY:  Tonsillectomy and appendectomy as a child.   SOCIAL HISTORY:  The patient lives in [place] alone.  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  He is divorced.  He has  two grown children, sons in their twenties.  He smoked when he was a  teenager through the age of 75, about two-and-a-half packs a day, but he  has stopped for over 40 years.  Rare ETOH use.  No illicit drug use.  No  exercise.  Not on a special diet.   FAMILY HISTORY:  Mother died of either an MI or a CVA at age 32.  She  was found.  Family history:  Father died at age 8 from an infection  received while he was in [place] War [place].  He is an only child.   MEDICATIONS:  Current medications  at home:  1. Niacin 500 mg b.i.d.  2. B12 once a day.  3. Vitamin D b.i.d.  4. Folic acid 800 mg b.i.d.  5. Fish oil once a day.  6. Vitamin E 2000 in the AM and 1000 in the PM.  7. Vitamin C 500 mg once a day.  8. Multivitamin once a day.  9. Aspirin 325 once a day.   ALLERGIES:  NO KNOWN DRUG ALLERGIES AS AN ADULT.  He was told he had a  sulfa allergy as an infant.   LABORATORY DATA:  Sodium 142, potassium 3.1 (this has been repleted at  [facility]), chloride 107, CO2 of 24, BUN 14, creatinine 1.0, glucose 144.  Hemoglobin is 15.9, hematocrit 46.9, white blood cells 11.6, platelets  265,000.  Total bili 0.7, AST 21, ALT 20, alkaline phosphatase 72, total  protein 7.1, albumin 4.2.  CK 150, MB 4.2, troponin 0.18, PTT 23.1, PT  11.9, INR 1.0, BNP 319.  EKG revealing heart rate of 80 to 88 beats per  minute, normal sinus rhythm with anterolateral T-wave inversion, V3  through V6 in lead I with hypertrophy noted.   PHYSICAL EXAMINATION:  VITAL SIGNS:  Blood pressure 154/96.  Heart rate  88.  Respirations 20.  Weight 315 pounds.  GENERAL:  He is an obese, Caucasian male who is awake, alert and  oriented, in no acute distress.  HEENT:  Head is normocephalic, atraumatic.  Eyes:  PERRLA.  Mucous  membranes in mouth are pink and moist.  Tongue is midline.  Neck is  supple.  He has good dentition.  His neck is obese, no thyromegaly  noted.  No carotid bruits are appreciated.  Unable to assess for JVD.  CARDIOVASCULAR:  Regular rate and rhythm with a soft S3 murmur.  His  heart sounds are distant.  EXTREMITIES:  2+ radial pulses and dorsalis pedis pulses are noted.  LUNGS:  Essentially clear to auscultation.  There are diminished  bibasilar, however.  ABDOMEN:  Obese, nontender with 2+ bowel sounds.  Mild ballottement is  noted.  EXTREMITIES:  There is mild clubbing in his toes and they are bluish in  the dependent position with 1+ edema noted in  pretibial area.  There are  no rashes or  lesions.  NEURO:  Cranial nerves II through XII are intact.   IMPRESSION:  1. Chest pain with electrocardiogram changes indicative of      anterolateral ischemia with positive troponin times one.  2. Hypertension.  3. Obesity.  4. Acute heart failure.  5. Unknown cholesterol status.   PLAN:  This is a 70 year old Caucasian male with cardiovascular risk  factors to include obesity, unknown lipid status, hypertension, age and  male gender who had sudden onset of shortness of breath and heartburn,  although heartburn has been chronic for him with bending over, while  sitting up playing solitaire about 2 AM this morning.  The patient was  seen in [facility] emergency room, given nitroglycerin, Lasix, aspirin and  Plavix 300 mg and the pain has been relieved.  He was transferred to  [facility] for further evaluation and possible cath.  The  patient is currently pain-free.  We will start the patient on a heparin  drip for anticoagulation, nitroglycerin drip for preload reduction, low-  dose ACE inhibitor and low-dose beta blocker along with aspirin.  The  patient will be monitored for elevation in cardiac enzymes which will be  cycled.  He will remain on oxygen.  We will also continue IV Lasix for  continued diuresis.  Check echocardiogram for LV function and evidence  of dysfunction.  We will monitor his blood pressure secondary to  hypertension.  The patient will have lipids and LFTs checked to evaluate  for risk stratification.  He will be advised on a low-calorie, weight  reduction diet and also check a hemoglobin A1C to evaluate for type 2  diabetes.  This has been discussed with Dr. Charlton Haws, who is in  agreement, and cardiac catheterization will be planned for April 18, 2007  unless status changes and will need to be done emergently.      Bettey Mare. Lyman Bishop, NP      Rollene Rotunda, MD, [facility]  Electronically Signed    KML/MEDQ  D:  04/16/2007  T:  04/16/2007   Job:  [id number]   cc:   Doreen Beam

## 2011-02-22 NOTE — Consult Note (Signed)
NAME:  [name], [name]                 ACCOUNT NO.:  [id number]   MEDICAL RECORD NO.:  [id number]          PATIENT TYPE:  INP   LOCATION:  3731                         FACILITY:  [facility]   PHYSICIAN:  Salvatore Decent. Cornelius Moras, M.D. DATE OF BIRTH:  10-04-1941   DATE OF CONSULTATION:  04/17/2007  DATE OF DISCHARGE:                                 CONSULTATION   REASON FOR CONSULTATION:  Severe three-vessel coronary artery disease.   HISTORY OF PRESENT ILLNESS:  [name] is a 70 year old morbidly obese  white male with no previous history of coronary artery disease, but risk  factors including history of hypertension, newly discovered  hyperlipidemia, and morbid obesity.  The patient states he was in his  usual state of health until approximately 2 weeks ago when he had a  prolonged episode of burning epigastric pain that he described as  heartburn.  He has had some similar symptoms off and on for many years,  typically exacerbated by eating meals or bending over.  He had a  prolonged episode approximately 2 weeks ago, although this resolved and  he did not think anything of that.  The early morning of July 7 he  developed acute onset of shortness of breath at rest which then became  associated with burning epigastric pain across his chest and upper  abdomen.  He presented to the  emergency room at [facility] in  [place] where initial electrocardiograms revealed some mild T-wave  inversion in anterolateral leads without acute ST or T-wave elevation.  Cardiac enzymes were abnormal with mildly increased troponin levels.  He  was transferred to [facility] where he remained stable medically.  He  was given 300 mg of Plavix at the time of his initial presentation at  [facility].  He was also notably hypertensive at the time.  Following  arrival at [facility], Mr. Vidrio has continued to do well without any  further symptoms of chest pain or shortness of breath.  He underwent  elective cardiac  catheterization by Dr. Tonny Bollman on the evening of  July 8.  He was found to have severe three-vessel coronary artery  disease with mild left ventricular dysfunction.  Cardiac surgical  consultation was requested.   REVIEW OF SYSTEMS:  General:  The patient reports that his exercise  tolerance, appetite and weight have remained stable.  He currently  weighs in excess of 300 pounds.  He is 5 feet 9 inches tall.  He has not  been gaining or losing weight recently.  Cardiac:  Notable for episodes  of burning epigastric pain that the patient attributes to heartburn that  had been going on and off for years.  The patient has stable mild  exertional shortness of breath.  He denies he otherwise denies resting  shortness of breath, PND, orthopnea, other than the shortness of breath  that occurred at the time of acute presentation 2 days ago.  The patient  has mild chronic bilateral lower extremity edema.  Respiratory:  Negative.  The patient denies productive cough, hemoptysis, wheezing.  Gastrointestinal:  Notable only for the  patient's reported history of  heartburn.  The patient denies any difficulty swallowing.  He reports no  hematochezia, hematemesis, melena.  No history of any signs of  incarceration of umbilical hernia.  Musculoskeletal:  Notable for mild  arthritis and arthralgias in the fingers of his hands.  Neurologic:  Negative.  The patient denies symptoms suggestive of previous TIA or  stroke.  Genitourinary:  Negative.  HEENT:  Negative.  Psychiatric:  Negative.   PAST MEDICAL HISTORY:  1. Hypertension.  2. Hyperlipidemia.  3. Morbid obesity.  4. Remote history of gout.  5. Remote history of pneumonia.  6. Umbilical hernia.   PAST SURGICAL HISTORY:  1. Tonsillectomy.  2. Appendectomy   SOCIAL HISTORY:  The patient is divorced and lives alone in [place].  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  He is two grown sons who are  supportive who live nearby.  The  patient has remote history of tobacco use during his teenage years,  although he quit smoking at age 46.  He denies significant alcohol  consumption.   FAMILY HISTORY:  The patient's mother died of stroke or heart attack at  age 17.   MEDICATIONS PRIOR TO ADMISSION:  1. Niacin 500 mg twice daily.  2. Vitamin B12 once daily.  3. Vitamin D 1 tablet twice daily.  4. Folic acid 800 mg twice daily.  5. Fish oil capsule once daily.  6. Vitamin E 2000 units every morning and 1000 every evening.  7. Vitamin C 500 mg daily.  8. Multivitamin 1 tablet daily.  9. Aspirin 325 mg daily.   DRUG ALLERGIES:  SULFA.   PHYSICAL EXAMINATION:  GENERAL:  The patient is a morbidly obese white  male who appears his stated age in no acute distress.  He is in sinus  rhythm.  He has been hypertensive although blood pressure is under good  control today.  HEENT:  Exam is unrevealing.  NECK:  Neck is supple.  There is no cervical nor supraclavicular  lymphadenopathy.  There is no jugular venous distension.  No carotid  bruits noted.  CHEST:  Auscultation of the chest reveals clear symmetrical breath  sounds bilaterally.  No wheezes or rhonchi noted.Marland Kitchen  CARDIOVASCULAR:  Exam includes regular rate and rhythm.  No murmurs,  rubs or gallops are appreciated.  ABDOMEN:  The abdomen is obese but soft, nontender.  No palpable masses.  Bowel sounds are present.  There is an umbilical hernia which is easily  reducible.  EXTREMITIES:  Warm and adequately perfused.  There is mild bilateral  lower extremity edema.  Distal pulses are thready barely palpable in the  posterior tibial position.  There is mild hemosiderosis in the skin  changes in the distal left lower left distal both lower legs.  SKIN:  Otherwise no skin rashes or abnormalities are noted.  Rectal and GU exams both deferred.  NEUROLOGIC:  Examination is grossly nonfocal and symmetrical throughout.   DIAGNOSTIC TESTS:   Cardiac catheterization performed by Dr. Excell Seltzer is  reviewed.  This demonstrates severe three-vessel coronary artery disease  with mild left ventricular dysfunction.  Specifically, there is long  segment 95% proximal stenosis of the left anterior descending coronary  artery prior to takeoff of a small first diagonal branch.  There is 90%  stenosis within the diagonal branch.  There is 100% subtotal occlusion  of the mid left anterior descending coronary artery after takeoff of  the  diagonal branch.  There is a huge ramus intermediate branch which  supplies the majority of the anterolateral wall of the left ventricle.  There is 70% proximal stenosis of this vessel.  The distal left  circumflex coronary artery beyond takeoff of the intermediate branch is  occluded, although no terminal sub branches are noted.  There is 100%  proximal occlusion of the right coronary artery.  There is left-to-right  collateral filling of a posterolateral branch off the distal right  coronary artery.  Posterior descending coronary artery is never well-  visualized.  Left ventricular function is mildly reduced with ejection  fraction estimated at 45-50% with inferior wall hypokinesis.   IMPRESSION:  Severe three-vessel coronary artery disease with acute  coronary syndrome and acute systolic and diastolic heart failure with  hypertension.  Left ventricular systolic function appears preserved.  The patient was given 300 mg of Plavix at the time of his initial  presentation July 7.  I believe he would best be treated with surgical  revascularization.   PLAN:  I have discussed options at length with Mr. Beddow.  Alternative  treatment strategies have been discussed.  He understands and accepts  all associated risks of surgery including, but not limited to risk of  death, stroke, myocardial infarction, congestive heart failure,  respiratory failure, pneumonia, bleeding requiring blood transfusion,  arrhythmia,  infection, and recurrent coronary artery disease.  All his  questions have been addressed.  We tentatively plan to proceed with  surgery on Friday July 11.      Salvatore Decent. Cornelius Moras, M.D.  Electronically Signed     CHO/MEDQ  D:  04/19/2007  T:  04/20/2007  Job:  [id number]   cc:   Veverly Fells. Excell Seltzer, MD  Dhruv Raliegh Ip C. Eden Emms, MD, [facility]

## 2011-02-22 NOTE — Assessment & Plan Note (Signed)
HEALTHCARE                       [place] CARDIOLOGY OFFICE NOTE   NAME:[name], [name] HARJU                        MRN:          [id number]  DATE:07/18/2007                            DOB:          [birth year]    The patient returns today for followup. He is status post recent  coronary artery bypass surgery by Dr. Cornelius [name].   He had a non-ST segment elevation myocardial infarction. He had a LIMA  to the LAD, vein graft to the first diagonal, vein graft to the ramus  and a vein graft to the right posterolateral branch. He had endoscopic  vein harvest sites. He had brief period of PAF. He was sent home on  Pacerone and this has stopped. He has maintained sinus rhythm. The  patient has significant hypercholesterolemia, hypertension. He has had  some lower extremity edema. He is concerned about his chest. His  postoperative x-ray showed good sternal wound healing. However, his  general work involves lifting 60-70 pounds. I told him I would keep him  out until November 1.   The patient needs generic metoprolol. He has not been taking his Lipitor  due to the cost as well. We will give him a generic prescription for  simvastatin. He had been on lisinopril. He had a cough with lisinopril  and this was stopped. However, I told him that his blood pressure has  been high and he continues to have lower extremity edema. I would like  to try him on Hyzaar 100/12.5.   In regards to his chest, he feels that he is having pain in his chest.  It is clearly position and muscular. This has gone on for the last week  or two. It sounds more like arthritic type healing of his chest. There  has been no evidence of Dressler syndrome, fevers or rub. There has been  no pleuritic pain. However, given his overall body habitus and soreness,  I think it is reasonable to continue to treat him with nonsteroidals and  have him go back to work on November 1.   REVIEW OF SYSTEMS:   Otherwise, remarkable for an enlarging umbilical  hernia. This will probably need surgical repair.   CURRENT MEDICATIONS:  1. Lopressor 50 b.i.d.  2. Aspirin a day.  3. He will also be on simvastatin 40 a day.  4. Hyzaar 100/12.5.   PHYSICAL EXAMINATION:  Is remarkable for weight of 315, blood pressure  150/100, respiratory rate is 14, pulse 72.  HEENT: Is unremarkable. Carotids are normal. There is no thyromegaly. No  lymphadenopathy. No JVP elevation.  LUNGS:  Are clear with good diaphragmatic motion.  No wheezing.  Sternum is well-healed. There are no obvious non-union. There is no  crepitus. His scar is well-healed. There is S1, S2 with distant heart  sounds.  ABDOMEN: Is protuberant. He has a moderate-sized umbilical hernia. There  is no AAA. No bruit. No tenderness. No hepatosplenomegaly or  hepatojugular reflux.  Femorals are +3 bilaterally. He has +1 lower extremity edema  bilaterally. PT's are +2.  NEURO: Is nonfocal.  SKIN: Is warm and dry.  There is no muscular weakness.   IMPRESSION:  1. Stable coronary artery disease status post myocardial infarction      with coronary artery bypass graft. Continue aspirin and beta-      blocker therapy.  2. Hypercholesterolemia. Switch to generic simvastatin 40 mg a day.      Followup lipid and liver profile in six months.  3. Significant hypertension with lower extremity edema. Given his      overall situation, we will try him on Hyzaar given that he had a      cough with lisinopril. He will take this and continue a low salt      diet and elevate his legs at the end of the day and see me back in      8-10 weeks.  4. Umbilical hernia. May need surgical repair. 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      I gave him the names of Dr.  Derrell [name], [name] and [name] to see      if they are in his [facility] network.   He knows to go to the emergency room immediately if he were to  have any  pain in this area with non-reduction of the hernia.   Overall, I think he is progressing well and I will see him back in 8-10  weeks to reassess his blood pressure and edema.     [name] Pick. Eden Emms, MD, [facility]  Electronically Signed    PCN/MedQ  DD: 07/18/2007  DT: 07/18/2007  Job #: [id number]

## 2011-02-22 NOTE — Assessment & Plan Note (Signed)
[place] [facility]                       [place] CARDIOLOGY OFFICE NOTE   NAME:Winer, ISAIAHS CHANCY                        MRN:          [id number]  DATE:01/21/2008                            DOB:          [birth year]    [name] returns today for follow-up.  He had CABG last year.  He is  morbidly obese with hypertension, hypercholesterolemia.  He recently  underwent hernia surgery successfully.  I had a long discussion with  Avaneesh today.  He needs to have a lap band type procedure.  He would be  a Medicare patient.  I gave him different options to look into this.  Long-term effects in regards to diabetes, his knees and overall health  were discussed at length.  The patient is up to over 350 pounds.   From a cardiac perspective, he is stable.  He is ambulating.  He has  exertional dyspnea due to his weight.  No chest pain, PND, orthopnea, no  palpitations.  He has mild lower extremity edema.  He is having some  difficulty affording his Hyzaar.   CURRENT MEDICATIONS:  1. Hyzaar 112.5.  2. An aspirin a day.  3. Folic acid.  4. Simvastatin 40 a day.  5. Vitamins.  6. Fish oil.  7. Metoprolol 50 b.i.d.  8. Niacin 500 a day.   PHYSICAL EXAMINATION:  GENERAL APPEARANCE:  Remarkable for a morbidly  obese male.  VITAL SIGNS:  His weight has gone from 321-331, blood pressure is  128/82, pulse 76 and regular, respiratory rate 18, afebrile.  HEENT:  Unremarkable.  NECK:  Carotids are normal without bruit.  No lymphadenopathy,  thyromegaly or JVP elevation.  LUNGS:  Clear with good diaphragmatic motion.  No wheezing.  CARDIAC:  S1 and S2, distant heart sounds, sternotomy well-healed.  ABDOMEN:  Protuberant.  Recent surgery through the umbilicus with a  small laparoscopic scar, still with morbid obesity.  No bruit.  No  tenderness.  No hepatosplenomegaly, no hepatojugular reflux.  EXTREMITIES:  Distal pulses intact, +1 edema, no varicosities.  NEURO:  Nonfocal.  SKIN:  Warm and dry.  MUSCULOSKELETAL:  No muscular weakness.   IMPRESSION:  1. Morbid obesity.  Referral for a lap band procedure, bariatric      surgery.  He will call us if he wants [place] to get him in to see the      people at [facility].  2. Hypertension, currently well-controlled.  Samples of Diovan      160/12.5 given in lieu of not having any Hyzaar.  Continue low salt      diet.  3. Lower extremity edema.  Continue diuretic 12.5 mg a day, low-salt      diet.  May need to be switched Lasix in the future.  4. Previous coronary artery bypass grafting.  No chest pain.  No need      for stress testing.  Would be difficult to do in this patient      anyway.  5. Murmur.  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  Follow-up      echo in six months.  6. Hypercholesterolemia in the setting of coronary disease.  Continue      simvastatin and niacin.  Lipid and liver profile in six months.     Noralyn Pick. Eden Emms, MD, [facility]  Electronically Signed    PCN/MedQ  DD: 01/21/2008  DT: 01/21/2008  Job #: [id number]

## 2011-02-22 NOTE — Cardiovascular Report (Signed)
NAMEHARSHAN, KEARLEY                 ACCOUNT NO.:  [id number]   MEDICAL RECORD NO.:  [id number]          PATIENT TYPE:  INP   LOCATION:  3731                         FACILITY:  [facility]   PHYSICIAN:  Veverly Fells. Excell Seltzer, MD  DATE OF BIRTH:  12/09/1940   DATE OF PROCEDURE:  04/17/2007  DATE OF DISCHARGE:                            CARDIAC CATHETERIZATION   PROCEDURE:  Left heart catheterization, selective coronary angiography,  left ventricular angiography and StarClose of the right femoral artery.   INDICATIONS:  Mr. Burgueno is a 70 year old gentleman who presented with  chest pain and had a presentation consistent with an acute coronary  syndrome.  He had T-wave changes on his ECG that were suggestive of  ischemia.  He also had elevated troponin.  He was referred for cardiac  catheterization in the setting of his suggestive symptoms and positive  objective findings.   Risks and indications of the procedure were explained to the patient.  Informed consent was obtained.  The right groin was prepped, draped and  anesthetized with 1% lidocaine.  Using modified Seldinger technique a 6-  French sheath was placed in the right femoral artery.  Multiple views of  the left coronary artery were taken using a JL-4 catheter.  A single  view of the right coronary artery was taken with a JR-4 catheter and  nonselective injection.  An angled pigtail catheter was then inserted  into the left ventricle and pressures were recorded.  The left  ventriculogram was performed.  A pullback across the aortic valve was  done.  At the conclusion of the case, a StarClose device was used to  seal the femoral arteriotomy.  The access site was just at the  bifurcation, but I suspected that the risk of closure device  complication was less than the risk of manual pressure in a patient of  Mr. Uptain's size.  The closure device was deployed without difficulty and  the procedure was well tolerated.  There were no immediate  complications.   FINDINGS:  Aortic pressure is 147/85 with a mean of 114, left  ventricular pressure is 148/31.   The left mainstem has distal stenosis in the range of only 20-30%.  It  is clearly nonobstructive.  The left main trifurcates into the LAD, a  large intermediate branch and the left circumflex.   The LAD is a large-caliber vessel in its proximal portion.  There is 70-  80% stenosis of the proximal LAD and the LAD is then totally occluded  just after the first diagonal.  The remaining portions of the mid and  distal LAD fill late.  The first diagonal branch has severe stenosis in  the range of 95-99%.  The diagonal branch is a small to medium size  vessel.  The mid and distal LAD, which fill late, appears to be a  reasonably sized vessel with probable diffuse disease.   The ramus intermedius branch is a very large vessel.  There is a  proximal 70% stenosis followed by a mid 75% stenosis in a bend of the  vessel.  There is no  other significant angiographic disease in that  vessel.   The left circumflex is small.  It is occluded in its proximal portion.   The right coronary artery is occluded proximally.  There appears to be a  90-95% lesion followed by total occlusion in the mid portion of the  vessel.  The distal right coronary artery fills via left-to-right  collaterals.   Left ventricular function assessed in the 30 degrees right anterior  oblique projection shows inferobasal akinesis with an LVEF estimated at  45%.  There is no significant mitral regurgitation appreciated.   ASSESSMENT:  1. Severe three-vessel coronary artery disease.  2. Mild segmental left ventricular dysfunction.  3. Increased left ventricular filling pressures.   PLAN:  Mr. Daluz has surgical coronary anatomy.  I think his best  treatment option would clearly be coronary bypass in the setting of his  multivessel disease with total occlusion of the right coronary artery  and total occlusion of  the LAD with a reasonable LAD target.  He also  has high-grade disease in his large intermediate branch and would  benefit from a bypass to that vessel.  I spoke with Dr. Cornelius Moras who is  going to evaluate the patient.  His biggest risk for surgery relates to  his morbid obesity.  Otherwise he has preserved renal function and good  functional capacity.      Veverly Fells. Excell Seltzer, MD  Electronically Signed     MDC/MEDQ  D:  04/17/2007  T:  04/18/2007  Job:  [id number]

## 2011-02-22 NOTE — Discharge Summary (Signed)
NAMEOLOF, MARCIL                 ACCOUNT NO.:  [id number]   MEDICAL RECORD NO.:  [id number]          PATIENT TYPE:  INP   LOCATION:  2002                         FACILITY:  [facility]   PHYSICIAN:  Salvatore Decent. Cornelius Moras, M.D. DATE OF BIRTH:  June 21, 1941   DATE OF ADMISSION:  04/16/2007  DATE OF DISCHARGE:                               DISCHARGE SUMMARY   DATE OF ANTICIPATED DISCHARGE:  April 26, 2007.   PRIMARY ADMITTING DIAGNOSIS:  Chest pain.   ADDITIONAL/DISCHARGE DIAGNOSES:  1. Non-ST elevation myocardial infarction.  2. Hypertension.  3. Hyperlipidemia.  4. Postoperative atrial fibrillation.  5. History of gout.  6. Remote history of pneumonia.  7. Morbid obesity.   PROCEDURES PERFORMED:  1. Cardiac catheterization.  2. Coronary artery bypass grafting x4 (left internal mammary artery to      the distal left anterior descending artery, saphenous vein graft to      the first diagonal, saphenous vein graft to the ramus intermedius,      saphenous vein graft to the right posterolateral).  3. Endoscopic vein harvest, bilateral thighs.   HISTORY:  The patient is a 70 year old male with no previous history of  coronary artery disease.  Approximately 2 weeks prior to admission he  had a prolonged episode of burning epigastric pain which he felt was  heartburn.  This episode was somewhat prolonged but resolved without  specific treatment.  On the morning of July 7 he developed acute onset  of shortness of breath at rest, which became associated with burning  epigastric-type pain across his chest and upper abdomen and he  subsequently presented to the emergency department at [facility]  for evaluation.  He was found to have some EKG changes as well as an  elevated troponin.  Because of these findings he was treated with  Plavix, nitroglycerin, Lasix and aspirin and was transferred to [facility] for further cardiac workup.  Following his arrival at [facility], he was  seen by [facility]  [facility] and was started on a heparin drip, a  nitroglycerin drip, a low-dose beta blocker, an ACE inhibitor and  aspirin and was admitted for further evaluation.   HOSPITAL COURSE:  Mr. Oyster stabilized and his chest pain resolved.  He  was taken to the cardiac catheterization lab on April 17, 2007, Dr. Excell Seltzer  and was found to have severe three-vessel coronary artery disease with a  left ventricular ejection fraction estimated at 45%.  Because of his  significant coronary artery disease and his coronary anatomy, he was not  felt to be a candidate for percutaneous intervention and a  cardiothoracic surgery consultation was obtained.  The patient was seen  by Dr. Tressie Stalker, who reviewed his films and felt that his best  course of action would be to proceed with CABG at this time.  He  explained the risks, benefits and alternatives of the procedure to the  patient and he agreed to proceed with surgery.  He remained stable and  pain-free in the hospital prior to surgery.  His preoperative workup  included carotid Doppler studies, which  showed no ICA stenosis, and  ABIs, which were normal bilaterally.  He also had pulmonary function  studies, which indicated minimal obstructive airway disease.  He was  taken to the operating room on April 20, 2007, and underwent CABG x4 as  described in detail above.  He tolerated the procedure well and was  transferred to the SICU in stable condition.  He was able to be  extubated shortly after surgery.  He was hemodynamically stable and  doing well on postop day #1.  He did have some mild hypotension  initially, requiring a Neo-Synephrine drip.  However, this was weaned  and discontinued over the course of his first postoperative day.  By  postop day #2 his blood pressure had stabilized and he was started on a  low-dose beta blocker and was able to be transferred to the floor.  He  has been very volume-overloaded during this admission and has been   treated aggressively with diuretics.  He also developed atrial  fibrillation on postop day #3 with heart rates in the 120s.  He was  started on an amiodarone drip and was able to convert to normal sinus  rhythm.  Overall he has continued to progress well.  He is maintaining  normal sinus rhythm and at this point has been switched from IV to oral  amiodarone.  He is diuresing well and is back down to his preoperative  weight although on physical exam a continues to remain edematous in his  lower extremities.  He is ambulating with cardiac rehab phase 1 and is  making good progress.  He has remained afebrile and all vital signs have  been stable.  His incisions are all healing well.  His most recent labs  showed a sodium of 135, potassium 3.9, which is replaced, BUN 20,  creatinine 1.15.  Hemoglobin 9.1, hematocrit 26.5, white count 12.6,  platelets 145.  He is tolerating a regular diet and is having normal  bowel and bladder function.  It is anticipated that if he remains stable  over the next 24 hours and no acute rhythm changes occur, he will,  hopefully, be ready for discharge home on April 26, 2007.   DISCHARGE MEDICATIONS:  1. Enteric-coated aspirin 325 mg daily.  2. Lopressor 50 mg b.i.d.  3. Folic acid 1 mg daily.  4. Lipitor 80 mg q.h.s.  5. Amiodarone 400 mg b.i.d. x1 week, then 200 mg b.i.d.  6. Lasix 40 mg daily x1 week.  7. K-Dur 20 mEq daily x1 week.  8. Niacin 500 mg b.i.d.  9. Vitamin D 2000 units b.i.d.  10.Vitamin B12 500 mg daily.  11.Vitamin E 2000 units q.a.m. and 1000 inches q.p.m.  12.Fish oil daily.  13.Vitamin C 500 mg day.  14.Multivitamin one daily.  15.Tylox one to two q.4h. p.r.n. for pain.   DISCHARGE INSTRUCTIONS:  He is asked to refrain from driving, heavy  lifting or strenuous activity.  He may continue ambulating daily and  using his incentive spirometer.  He may shower daily and clean his  incisions with soap and water.  He will continue a low-fat,  low-sodium  diet.   DISCHARGE FOLLOWUP:  He will need to make an appointment to see Dr.  Eden Emms in 2 weeks.  He will then follow up Dr. Cornelius Moras August 4 at 11:30  a.m.  He will have a chest x-ray at 11 a.m. at [facility] and  should bring the films to his appointment with Dr. Cornelius Moras.  In the  interim, if he experiences problems or has questions, he is asked to  contact our office.      Coral Ceo, P.A.      Salvatore Decent. Cornelius Moras, M.D.  Electronically Signed    GC/MEDQ  D:  04/25/2007  T:  04/26/2007  Job:  [id number]   cc:   Noralyn Pick. Eden Emms, MD, [facility]  Dhruv Sherril Croon

## 2011-07-25 LAB — BASIC METABOLIC PANEL
BUN: 20
BUN: 21
CO2: 28
CO2: 30
Calcium: 8.3 — ABNORMAL LOW
Calcium: 8.9
Chloride: 98
Creatinine, Ser: 1.15
Creatinine, Ser: 1.45
Glucose, Bld: 110 — ABNORMAL HIGH
Glucose, Bld: 157 — ABNORMAL HIGH
Sodium: 134 — ABNORMAL LOW

## 2011-07-26 LAB — POCT I-STAT 3, ART BLOOD GAS (G3+)
Acid-Base Excess: 2
Acid-Base Excess: 4 — ABNORMAL HIGH
Acid-base deficit: 1
Acid-base deficit: 2
Bicarbonate: 26.7 — ABNORMAL HIGH
Bicarbonate: 27.5 — ABNORMAL HIGH
O2 Saturation: 100
O2 Saturation: 100
O2 Saturation: 93
O2 Saturation: 96
Operator id: 199901
Operator id: 3291
TCO2: 25
TCO2: 28
pCO2 arterial: 39
pCO2 arterial: 41.9
pO2, Arterial: 279 — ABNORMAL HIGH

## 2011-07-26 LAB — BASIC METABOLIC PANEL
BUN: 16
BUN: 17
CO2: 28
CO2: 29
Calcium: 7.5 — ABNORMAL LOW
Calcium: 8 — ABNORMAL LOW
Calcium: 8.6
Chloride: 106
Creatinine, Ser: 0.94
Creatinine, Ser: 1.03
GFR calc Af Amer: >60
GFR calc Af Amer: >60
GFR calc non Af Amer: >60
GFR calc non Af Amer: >60
GFR calc non Af Amer: >60
Glucose, Bld: 106 — ABNORMAL HIGH
Glucose, Bld: 107 — ABNORMAL HIGH
Glucose, Bld: 113 — ABNORMAL HIGH
Glucose, Bld: 143 — ABNORMAL HIGH
Glucose, Bld: 174 — ABNORMAL HIGH
Potassium: 3.7
Sodium: 136
Sodium: 138
Sodium: 138
Sodium: 140

## 2011-07-26 LAB — COMPREHENSIVE METABOLIC PANEL
ALT: 18
AST: 19
CO2: 29
Chloride: 102
GFR calc Af Amer: >60
GFR calc non Af Amer: >60
Sodium: 140
Total Bilirubin: 0.8

## 2011-07-26 LAB — CBC
HCT: 42
HCT: 43.5
Hemoglobin: 14.1
Hemoglobin: 14.1
Hemoglobin: 14.9
Hemoglobin: 9 — ABNORMAL LOW
Hemoglobin: 9.1 — ABNORMAL LOW
MCHC: 33.6
MCHC: 33.9
MCHC: 34.2
MCV: 89.4
MCV: 90.8
Platelets: 145 — ABNORMAL LOW
Platelets: 167
RBC: 3.14 — ABNORMAL LOW
RBC: 4.6
RBC: 4.62
RBC: 4.64
RBC: 4.87
RDW: 13
RDW: 13.4
RDW: 13.5
RDW: 13.6
WBC: 10.3
WBC: 12.6 — ABNORMAL HIGH
WBC: 14.9 — ABNORMAL HIGH
WBC: 8.5
WBC: 9.6

## 2011-07-26 LAB — LIPID PANEL
Cholesterol: 213 — ABNORMAL HIGH
HDL: 39 — ABNORMAL LOW
LDL Cholesterol: UNDETERMINED
Triglycerides: 435 — ABNORMAL HIGH

## 2011-07-26 LAB — POCT I-STAT 4, (NA,K, GLUC, HGB,HCT)
Glucose, Bld: 100 — ABNORMAL HIGH
Glucose, Bld: 139 — ABNORMAL HIGH
Glucose, Bld: 142 — ABNORMAL HIGH
Glucose, Bld: 97
HCT: 30 — ABNORMAL LOW
HCT: 33 — ABNORMAL LOW
HCT: 44
Hemoglobin: 10.2 — ABNORMAL LOW
Hemoglobin: 10.9 — ABNORMAL LOW
Hemoglobin: 10.9 — ABNORMAL LOW
Hemoglobin: 11.2 — ABNORMAL LOW
Hemoglobin: 11.2 — ABNORMAL LOW
Operator id: 3291
Operator id: 3291
Operator id: 3291
Operator id: 3291
Potassium: 4.2
Potassium: 4.4
Potassium: 4.5
Potassium: 4.9
Potassium: 5.1
Sodium: 134 — ABNORMAL LOW
Sodium: 134 — ABNORMAL LOW
Sodium: 135
Sodium: 136
Sodium: 136

## 2011-07-26 LAB — I-STAT EC8
Acid-base deficit: 1
Bicarbonate: 24
HCT: 30 — ABNORMAL LOW
Operator id: 294401
TCO2: 25
pCO2 arterial: 39.3

## 2011-07-26 LAB — TYPE AND SCREEN: Antibody Screen: NEGATIVE

## 2011-07-26 LAB — ABO/RH: ABO/RH(D): O POS

## 2011-07-26 LAB — HEMOGLOBIN AND HEMATOCRIT, BLOOD
HCT: 33.8 — ABNORMAL LOW
Hemoglobin: 11.3 — ABNORMAL LOW

## 2011-07-26 LAB — POCT I-STAT 3, VENOUS BLOOD GAS (G3P V)
O2 Saturation: 81
TCO2: 28
pCO2, Ven: 47
pH, Ven: 7.357 — ABNORMAL HIGH

## 2011-07-26 LAB — URINALYSIS, ROUTINE W REFLEX MICROSCOPIC
Bilirubin Urine: NEGATIVE
Nitrite: NEGATIVE
Specific Gravity, Urine: 1.007
pH: 6.5

## 2011-07-26 LAB — CARDIAC PANEL(CRET KIN+CKTOT+MB+TROPI)
CK, MB: 6.8 — ABNORMAL HIGH
Total CK: 211
Total CK: 7
Troponin I: 0.19 — ABNORMAL HIGH
Troponin I: 0.23 — ABNORMAL HIGH

## 2011-07-26 LAB — PROTIME-INR
INR: 1
INR: 1

## 2011-07-26 LAB — BLOOD GAS, ARTERIAL
FIO2: 0.21
pCO2 arterial: 40.7
pO2, Arterial: 76.8 — ABNORMAL LOW

## 2011-07-26 LAB — CREATININE, SERUM
Creatinine, Ser: 0.96
GFR calc Af Amer: >60
GFR calc non Af Amer: >60

## 2011-07-26 LAB — APTT: aPTT: 96 — ABNORMAL HIGH

## 2011-07-26 LAB — PLATELET COUNT: Platelets: 225

## 2011-07-26 LAB — MAGNESIUM: Magnesium: 2.3

## 2012-02-07 ENCOUNTER — Ambulatory Visit: Payer: Self-pay | Admitting: Urology

## 2013-03-06 DIAGNOSIS — I2510 Atherosclerotic heart disease of native coronary artery without angina pectoris: Secondary | ICD-10-CM

## 2013-04-16 ENCOUNTER — Encounter: Payer: Self-pay | Admitting: Cardiology

## 2013-04-17 ENCOUNTER — Encounter: Payer: Self-pay | Admitting: Cardiovascular Disease

## 2013-04-17 ENCOUNTER — Ambulatory Visit (INDEPENDENT_AMBULATORY_CARE_PROVIDER_SITE_OTHER): Payer: Medicare PPO | Admitting: Cardiovascular Disease

## 2013-04-17 VITALS — BP 117/81 | HR 67 | Ht 69.0 in | Wt 312.1 lb

## 2013-04-17 DIAGNOSIS — I10 Essential (primary) hypertension: Secondary | ICD-10-CM

## 2013-04-17 DIAGNOSIS — M7989 Other specified soft tissue disorders: Secondary | ICD-10-CM

## 2013-04-17 DIAGNOSIS — Z951 Presence of aortocoronary bypass graft: Secondary | ICD-10-CM | POA: Insufficient documentation

## 2013-04-17 DIAGNOSIS — R9439 Abnormal result of other cardiovascular function study: Secondary | ICD-10-CM

## 2013-04-17 DIAGNOSIS — I255 Ischemic cardiomyopathy: Secondary | ICD-10-CM

## 2013-04-17 DIAGNOSIS — I2589 Other forms of chronic ischemic heart disease: Secondary | ICD-10-CM

## 2013-04-17 DIAGNOSIS — I2510 Atherosclerotic heart disease of native coronary artery without angina pectoris: Secondary | ICD-10-CM

## 2013-04-17 DIAGNOSIS — E7800 Pure hypercholesterolemia, unspecified: Secondary | ICD-10-CM

## 2013-04-17 NOTE — Patient Instructions (Signed)
   Echo  Lab for [facility] will contact with results via phone or letter.   Continue all current medications. Follow up in  4 months

## 2013-04-17 NOTE — Progress Notes (Signed)
Patient ID: [name], male   DOB: 08/22/1941, 72 y.o.   MRN: [id number]    CARDIOLOGY CONSULT NOTE  Patient ID: [name] MRN: [id number] DOB/AGE: 1941/06/17 72 y.o.  Admit date: (Not on file) Primary Physician: Ernestine Conrad, MD Reason for Consultation: CAD s/p CABG, positive stress test, HTN, Hypercholesterolemia  HPI:  [name] is a 72 y.o. Male with a PMH significant for an MI (primary symptoms being shortness of breath) and 4-vessel CABG in July 2008, HTN, and hypercholesterolemia. He underwent a Lexiscan stress test in May 2014 (results documented below) which was positive for myocardial scar with peri-infarct ischemia in the inferolateral segment, EF 30%.  He feels well. He denies chest pain. If he walks a long distance (more than a quarter of a mile uphill), he gets "a little winded", but otherwise feels very well. About 2-3 months ago, he woke up one morning and was "gasping for breath". This led to him having a stress test. This happened over 3-4 days, which is when he went to see his PCP. He's had no recurrence of symptoms.  He denies palpitations. He has a chronic h/o leg swelling which is presently under control.  He had been on a statin but stopped it on his own, after researching the side effects.    Review of systems complete and found to be negative unless listed above in HPI  Past Medical History: see HPI, s/o appendectomy  SocHx: born in [place], grew up in [place], moved to [place] in 1985. Considering a second marriage with a woman in [place]. Enjoys traveling and reading. Worked at [employer] and used to be involved with an ice cream business. Has a son who lives in [place].  No family history on file.  History   Social History  . Marital Status: Divorced    Spouse Name: N/A    Number of Children: N/A  . Years of Education: N/A   Occupational History  . Not on file.   Social History Main Topics  . Smoking status: Former Smoker    Types:  Cigarettes    Quit date: 10/10/1972  . Smokeless tobacco: Not on file  . Alcohol Use: No  . Drug Use: No  . Sexually Active: Not on file   Other Topics Concern  . Not on file   Social History Narrative  . No narrative on file      (Not in a hospital admission)  Physical exam Blood pressure 117/81, pulse 67, height 5\' 9"  (1.753 m), weight 312 lb 1.9 oz (141.577 kg). General: NAD Neck: No JVD, no thyromegaly or thyroid nodule.  Lungs: Clear to auscultation bilaterally with normal respiratory effort. CV: Nondisplaced PMI.  Heart regular S1/S2, no S3/S4, II/VI pansystolic murmur heard throughout the precordium.  1+ pitting pedal edema.  No carotid bruit.  Normal pedal pulses.  Abdomen: Soft, nontender, no hepatosplenomegaly, no distention.  Skin: Intact without lesions or rashes.  Neurologic: Alert and oriented x 3.  Psych: Normal affect. Extremities: No clubbing or cyanosis.  HEENT: Normal.   Labs:   Lab Results  Component Value Date   WBC 12.6* 04/23/2007   HGB 9.1* 04/23/2007   HCT 26.5* 04/23/2007   MCV 90.2 04/23/2007   PLT 145* 04/23/2007   No results found for this basename: NA, K, CL, CO2, BUN, CREATININE, CALCIUM, LABALBU, PROT, BILITOT, ALKPHOS, ALT, AST, GLUCOSE,  in the last 168 hours Lab Results  Component Value Date   CKTOTAL 179 04/17/2007  CKMB 4.7* 04/17/2007   TROPONINI  Value: 0.19        PERSISTENTLY INCREASED TROPONIN VALUES IN THE RANGE OF 0.06-0.49 ng/mL CAN BE SEEN IN:       -UNSTABLE ANGINA* 04/17/2007    Lab Results  Component Value Date   CHOL  Value: 218        ATP III CLASSIFICATION:  <200     mg/dL   Desirable  161-096  mg/dL   Borderline High  >=045    mg/dL   High* 4/0/9811   CHOL  Value: 213        ATP III CLASSIFICATION:  <200     mg/dL   Desirable  914-782  mg/dL   Borderline High  >=956    mg/dL   High* 11/10/3084   Lab Results  Component Value Date   HDL 32* 04/18/2007   HDL 39* 04/17/2007   Lab Results  Component Value Date   LDLCALC  Value:  UNABLE TO CALCULATE IF TRIGLYCERIDE OVER 400 mg/dL        Total Cholesterol/HDL:CHD Risk Coronary Heart Disease Risk Table                     Men   Women  1/2 Average Risk   3.4   3.3 04/18/2007   LDLCALC  Value: 129        Total Cholesterol/HDL:CHD Risk Coronary Heart Disease Risk Table                     Men   Women  1/2 Average Risk   3.4   3.3* 04/17/2007   Lab Results  Component Value Date   TRIG 435* 04/18/2007   TRIG 227* 04/17/2007   Lab Results  Component Value Date   CHOLHDL 6.8 04/18/2007   CHOLHDL 5.5 04/17/2007   No results found for this basename: LDLDIRECT       EKG: Sinus rhythm, rate 67 bpm, LAD, nonspecific IVCD, PVC  Stress Test:  Lexiscan on 03-05-2013: Moderate size, moderate to severe in intensity inferior defect with partially reversible ischemia in the inferolateral segment, consistent with myocardial scar with peri-infarct ischemia, EF 30%, no TID.   ASSESSMENT AND PLAN:  1. CAD s/p 4-v CABG: he is currently asymptomatic. Will continue ASA and Metoprolol. He does not wish to be on a statin. He has tried to modify his diet by eating more vegetables and fruit and vegetable juices.  2. Abnormal stress test: again, he is asymptomatic. Will continue to carefully monitor him for symptoms. Will plan to obtain an echocardiogram to assess LV systolic function. 3. HTN: controlled on Diovan-HCT and Metoprolol. 4. Hypercholesterolemia: continue Lovaza and Niacin at this time. 5. Leg edema: takes Lasix 40 mg twice daily as needed. Will obtain a BMP to assess renal function in particular.  Signed: Prentice Docker, M.D., F.A.C.C. 04/17/2013, 8:51 AM

## 2013-04-25 ENCOUNTER — Other Ambulatory Visit (INDEPENDENT_AMBULATORY_CARE_PROVIDER_SITE_OTHER): Payer: Medicare PPO

## 2013-04-25 DIAGNOSIS — I255 Ischemic cardiomyopathy: Secondary | ICD-10-CM

## 2013-04-25 DIAGNOSIS — I2589 Other forms of chronic ischemic heart disease: Secondary | ICD-10-CM

## 2013-05-15 ENCOUNTER — Other Ambulatory Visit: Payer: Self-pay

## 2013-08-15 ENCOUNTER — Other Ambulatory Visit: Payer: Self-pay

## 2013-08-27 ENCOUNTER — Encounter: Payer: Self-pay | Admitting: Cardiovascular Disease

## 2013-08-27 ENCOUNTER — Ambulatory Visit (INDEPENDENT_AMBULATORY_CARE_PROVIDER_SITE_OTHER): Payer: Medicare PPO | Admitting: Cardiovascular Disease

## 2013-08-27 VITALS — BP 156/76 | HR 72 | Ht 69.0 in | Wt 321.0 lb

## 2013-08-27 DIAGNOSIS — Z23 Encounter for immunization: Secondary | ICD-10-CM

## 2013-08-27 DIAGNOSIS — E7800 Pure hypercholesterolemia, unspecified: Secondary | ICD-10-CM

## 2013-08-27 DIAGNOSIS — M7989 Other specified soft tissue disorders: Secondary | ICD-10-CM

## 2013-08-27 DIAGNOSIS — I2589 Other forms of chronic ischemic heart disease: Secondary | ICD-10-CM

## 2013-08-27 DIAGNOSIS — I10 Essential (primary) hypertension: Secondary | ICD-10-CM

## 2013-08-27 DIAGNOSIS — Z951 Presence of aortocoronary bypass graft: Secondary | ICD-10-CM

## 2013-08-27 DIAGNOSIS — I77810 Thoracic aortic ectasia: Secondary | ICD-10-CM

## 2013-08-27 DIAGNOSIS — R9439 Abnormal result of other cardiovascular function study: Secondary | ICD-10-CM

## 2013-08-27 DIAGNOSIS — I2510 Atherosclerotic heart disease of native coronary artery without angina pectoris: Secondary | ICD-10-CM

## 2013-08-27 DIAGNOSIS — I255 Ischemic cardiomyopathy: Secondary | ICD-10-CM

## 2013-08-27 NOTE — Patient Instructions (Signed)
Continue all current medications. Flu shot given today Your physician wants you to follow up in: 6 months.  You will receive a reminder letter in the mail one-two months in advance.  If you don't receive a letter, please call our office to schedule the follow up appointment

## 2013-08-27 NOTE — Progress Notes (Signed)
Patient ID: [name], male   DOB: 09-10-1941, 72 y.o.   MRN: [id number]      SUBJECTIVE:  [name] is a 72 y.o. Male with a PMH significant for an MI (primary symptoms being shortness of breath) and 4-vessel CABG in July 2008, HTN, and hypercholesterolemia. He underwent a Lexiscan stress test in May 2014 (results documented below) which was positive for myocardial scar with peri-infarct ischemia in the inferolateral segment, EF 30%.   He feels well. He denies chest pain and shortness of breath, as well as palpitations and lightheadedness. His legs stay chronically swollen, left more than right, for which he takes prn Lasix.  Earlier this year, he woke up one morning and was "gasping for breath". This led to him having a stress test. This happened over 3-4 days, which is when he went to see his PCP.  He's had no recurrence of symptoms.   He had been on a statin but stopped it on his own, after researching the side effects.   He has a BP cuff at home but rarely checks it.     Allergies  Allergen Reactions  . Ace Inhibitors     Cough   . Sulfa Antibiotics     Rash     Current Outpatient Prescriptions  Medication Sig Dispense Refill  . allopurinol (ZYLOPRIM) 300 MG tablet Take 300 mg by mouth daily.      Marland Kitchen aspirin 81 MG chewable tablet Chew 81 mg by mouth 2 (two) times daily.      Marland Kitchen b complex vitamins tablet Take 1 tablet by mouth daily.      . Calcium Carb-Cholecalciferol (CALCIUM + D3 PO) Take 2 tablets by mouth daily.      . Cholecalciferol (VITAMIN D-3) 5000 UNITS TABS Take 5,000 Units by mouth 2 (two) times daily.      . furosemide (LASIX) 40 MG tablet Take 40 mg by mouth as needed.       Marland Kitchen ibuprofen (ADVIL,MOTRIN) 800 MG tablet Take 800 mg by mouth every 8 (eight) hours as needed.       Marland Kitchen MAGNESIUM PO Take 1 tablet by mouth 2 (two) times daily.      . metoprolol (LOPRESSOR) 100 MG tablet Take 100 mg by mouth 2 (two) times daily.      . niacin 500 MG tablet Take 500 mg by  mouth daily with breakfast.       . omega-3 acid ethyl esters (LOVAZA) 1 G capsule Take 2 g by mouth 2 (two) times daily.      . potassium chloride (K-DUR,KLOR-CON) 10 MEQ tablet Take 10 mEq by mouth 2 (two) times daily.      . valsartan-hydrochlorothiazide (DIOVAN-HCT) 160-12.5 MG per tablet Take 1 tablet by mouth daily.      . vitamin C (ASCORBIC ACID) 500 MG tablet Take 500 mg by mouth daily.      Marland Kitchen zolpidem (AMBIEN) 10 MG tablet Take 10 mg by mouth at bedtime as needed for sleep.      Marland Kitchen amoxicillin (AMOXIL) 500 MG capsule Take 500 mg by mouth as directed. Use as needed for dental infections.       No current facility-administered medications for this visit.    Past Medical History  Diagnosis Date  . CAD (coronary artery disease)     CABG X4 vessels 04/2007  . HTN (hypertension)   . HLD (hyperlipidemia)     Past Surgical History  Procedure Laterality Date  . Colonoscopy  2003  . Coronary artery bypass graft      History   Social History  . Marital Status: Divorced    Spouse Name: N/A    Number of Children: N/A  . Years of Education: N/A   Occupational History  . Not on file.   Social History Main Topics  . Smoking status: Former Smoker    Types: Cigarettes    Quit date: 10/10/1972  . Smokeless tobacco: Not on file  . Alcohol Use: No  . Drug Use: No  . Sexual Activity: Not on file   Other Topics Concern  . Not on file   Social History Narrative  . No narrative on file     Filed Vitals:   08/27/13 0818  BP: 156/76  Pulse: 72  Height: 5\' 9"  (1.753 m)  Weight: 321 lb (145.605 kg)    PHYSICAL EXAM General: NAD  Neck: No JVD, no thyromegaly or thyroid nodule.  Lungs: Clear to auscultation bilaterally with normal respiratory effort.  CV: Nondisplaced PMI. Distant heart tones, regular rhythm, normal S1/S2, no S3/S4, II/VI pansystolic murmur heard throughout the precordium. 1+ pitting pedal edema in left leg, trace periankle edema in right leg. No  carotid bruit. Normal pedal pulses.  Abdomen: Soft, nontender, no hepatosplenomegaly, no distention.  Skin: Intact without lesions or rashes.  Neurologic: Alert and oriented x 3.  Psych: Normal affect.  Extremities: No clubbing or cyanosis. Stasis dermatitis bilaterally (legs). HEENT: Normal.    Stress Test: Lexiscan on 03-05-2013: Moderate size, moderate to severe in intensity inferior defect with partially reversible ischemia in the inferolateral segment, consistent with myocardial scar with peri-infarct ischemia, EF 30%, no TID.  Left ventricle: Images are severely limited. Parasternal long axis images suggest relatively good motion. Can not see endocardium well in any other view. I think overall EF may be normal, but I can not be sure. The cavity size was normal. Wall thickness was increased in a pattern of moderate LVH. Doppler parameters are consistent with abnormal left ventricular relaxation (grade 1 diastolic dysfunction). - Aortic valve: Aortic valve thickening with very mild AS. - Aorta: Mild dilitation of aortic root. Aortic root dimension: 43mm (ED). - Left atrium: The atrium was mildly dilated.    ASSESSMENT AND PLAN: 1. CAD s/p 4-v CABG: he is currently asymptomatic. Will continue ASA and Metoprolol. He does not wish to be on a statin. He has tried to modify his diet by eating more vegetables and fruit and vegetable juices.  2. Abnormal stress test: again, he is asymptomatic. Will continue to carefully monitor him for symptoms. He appears to have normal LV systolic function.  3. HTN: uncontrolled today on Diovan-HCT and Metoprolol. I have asked the patient to check blood pressure readings 4-5 times per week, at different times throughout the day, in order to get a better approximation of mean BP values. These results will be provided to me at the end of that period so that I can determine if antihypertensive medication titration is indicated. 4. Hypercholesterolemia:  continue Lovaza and Niacin at this time.  5. Leg edema: takes Lasix 40 mg twice daily as needed. GFR 41 ml/min in 04/2013. Will need close monitoring. 6. Mild aortic root dilatation: will need annual surveillance.   Prentice Docker, M.D., F.A.C.C.

## 2013-11-10 ENCOUNTER — Encounter: Payer: Self-pay | Admitting: Cardiovascular Disease

## 2013-11-13 ENCOUNTER — Encounter: Payer: Self-pay | Admitting: Cardiovascular Disease

## 2013-12-17 ENCOUNTER — Encounter: Payer: Self-pay | Admitting: Cardiovascular Disease

## 2014-03-04 ENCOUNTER — Ambulatory Visit: Payer: Medicare PPO | Admitting: Cardiovascular Disease

## 2014-04-07 ENCOUNTER — Encounter: Payer: Self-pay | Admitting: Cardiovascular Disease

## 2014-04-07 ENCOUNTER — Ambulatory Visit (INDEPENDENT_AMBULATORY_CARE_PROVIDER_SITE_OTHER): Payer: Medicare PPO | Admitting: Cardiovascular Disease

## 2014-04-07 VITALS — BP 133/82 | HR 92 | Ht 69.0 in | Wt 299.0 lb

## 2014-04-07 DIAGNOSIS — I25810 Atherosclerosis of coronary artery bypass graft(s) without angina pectoris: Secondary | ICD-10-CM

## 2014-04-07 DIAGNOSIS — I2589 Other forms of chronic ischemic heart disease: Secondary | ICD-10-CM

## 2014-04-07 DIAGNOSIS — M7989 Other specified soft tissue disorders: Secondary | ICD-10-CM

## 2014-04-07 DIAGNOSIS — E7800 Pure hypercholesterolemia, unspecified: Secondary | ICD-10-CM

## 2014-04-07 DIAGNOSIS — Z951 Presence of aortocoronary bypass graft: Secondary | ICD-10-CM

## 2014-04-07 DIAGNOSIS — I255 Ischemic cardiomyopathy: Secondary | ICD-10-CM

## 2014-04-07 DIAGNOSIS — I77810 Thoracic aortic ectasia: Secondary | ICD-10-CM

## 2014-04-07 DIAGNOSIS — I10 Essential (primary) hypertension: Secondary | ICD-10-CM

## 2014-04-07 NOTE — Progress Notes (Signed)
Patient ID: JUANANGEL SODERHOLM, male   DOB: May 07, 1941, 73 y.o.   MRN: [id number]      SUBJECTIVE: Mr. 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 He underwent a Lexiscan stress test in May 2014 which was positive for myocardial scar with peri-infarct ischemia in the inferolateral segment, EF 30%.   He was hospitalized for small bowel obstruction in February 2015 at [facility]. CBC was normal, BUN was 41 and creatinine was 1.85 but after fluids creatinine decreased to 1.67. BUN dropped from 41 to 37. His EKG demonstrated sinus rhythm with a nonspecific interventricular conduction delay. Troponins and BNP were normal. Abdominal and pelvic CT scan confirmed a small bowel obstruction. A lipid panel in March 2015 demonstrated total cholesterol 230, triglycerides 141, HDL 42, and LDL markedly elevated at 160. Chest x-ray in February was also normal.  He feels well. He denies chest pain and shortness of breath, as well as palpitations and lightheadedness. His legs stay chronically swollen, left more than right, for which he takes prn Lasix.   He had been on a statin but stopped it on his own, after researching the side effects. He still refuses to take one.  He recently returned from a 2 month trip to [place] where he got married to a woman named Tye [place] whom he was introduced to on a website called [place] [employer]. They were married on 5/28. His son went with him on this trip and also got engaged. The patient's wife is still in [place], and Mr. Busk is seriously considering relocating there.  While in [place], he ate one meal a day and began walking and lost 26 pounds in 3 weeks.     Allergies  Allergen Reactions  . Ace Inhibitors     Cough   . Sulfa Antibiotics     Rash     Current Outpatient Prescriptions  Medication Sig Dispense Refill  . allopurinol  (ZYLOPRIM) 300 MG tablet Take 300 mg by mouth daily.      Marland Kitchen amoxicillin (AMOXIL) 500 MG capsule Take 500 mg by mouth as directed. Use as needed for dental infections.      Marland Kitchen aspirin 81 MG chewable tablet Chew 81 mg by mouth 2 (two) times daily.      Marland Kitchen b complex vitamins tablet Take 1 tablet by mouth daily.      . Calcium Carb-Cholecalciferol (CALCIUM + D3 PO) Take 2 tablets by mouth daily.      . Cholecalciferol (VITAMIN D-3) 5000 UNITS TABS Take 5,000 Units by mouth 2 (two) times daily.      . furosemide (LASIX) 40 MG tablet Take 40 mg by mouth as needed.       Marland Kitchen ibuprofen (ADVIL,MOTRIN) 800 MG tablet Take 800 mg by mouth every 8 (eight) hours as needed.       Marland Kitchen MAGNESIUM PO Take 1 tablet by mouth 2 (two) times daily.      . metoprolol (LOPRESSOR) 100 MG tablet Take 100 mg by mouth 2 (two) times daily.      . niacin 500 MG tablet Take 500 mg by mouth daily with breakfast.       . omega-3 acid ethyl esters (LOVAZA) 1 G capsule Take 2 g by mouth 2 (two) times daily.      . potassium chloride (K-DUR,KLOR-CON) 10 MEQ tablet Take 10 mEq by mouth 2 (  two) times daily.      . valsartan-hydrochlorothiazide (DIOVAN-HCT) 160-12.5 MG per tablet Take 1 tablet by mouth daily.      . vitamin C (ASCORBIC ACID) 500 MG tablet Take 500 mg by mouth daily.      Marland Kitchen zolpidem (AMBIEN) 10 MG tablet Take 10 mg by mouth at bedtime as needed for sleep.       No current facility-administered medications for this visit.    Past Medical History  Diagnosis Date  . CAD (coronary artery disease)     CABG X4 vessels 04/2007  . HTN (hypertension)   . HLD (hyperlipidemia)     Past Surgical History  Procedure Laterality Date  . Colonoscopy      2003  . Coronary artery bypass graft      History   Social History  . Marital Status: Married    Spouse Name: N/A    Number of Children: N/A  . Years of Education: N/A   Occupational History  . Not on file.   Social History Main Topics  . Smoking status: Former Smoker     Types: Cigarettes    Quit date: 10/10/1972  . Smokeless tobacco: Never Used  . Alcohol Use: No  . Drug Use: No  . Sexual Activity: Not on file   Other Topics Concern  . Not on file   Social History Narrative  . No narrative on file     Filed Vitals:   04/07/14 1302  BP: 133/82  Pulse: 92  Height: 5\' 9"  (1.753 m)  Weight: 299 lb (135.626 kg)    PHYSICAL EXAM General: NAD  Neck: No JVD, no thyromegaly or thyroid nodule.  Lungs: Clear to auscultation bilaterally with normal respiratory effort.  CV: Nondisplaced PMI. Distant heart tones, regular rhythm, normal S1/S2, no E5/U3, II/VI pansystolic murmur heard throughout the precordium. 1+ pitting pedal edema in left leg, trace periankle edema in right leg. No carotid bruit. Normal pedal pulses.  Abdomen: Soft, nontender, no hepatosplenomegaly, no distention.  Skin: Intact without lesions or rashes.  Neurologic: Alert and oriented x 3.  Psych: Normal affect.  Extremities: No clubbing or cyanosis. Stasis dermatitis bilaterally (legs).  HEENT: Normal.   Stress Test: Lexiscan on 03-05-2013: Moderate size, moderate to severe in intensity inferior defect with partially reversible ischemia in the inferolateral segment, consistent with myocardial scar with peri-infarct ischemia, EF 30%, no TID.   Left ventricle: Images are severely limited. Parasternal long axis images suggest relatively good motion. Can not see endocardium well in any other view. I think overall EF may be normal, but I can not be sure. The cavity size was normal. Wall thickness was increased in a pattern of moderate LVH. Doppler parameters are consistent with abnormal left ventricular relaxation (grade 1 diastolic dysfunction). - Aortic valve: Aortic valve thickening with very mild AS. - Aorta: Mild dilitation of aortic root. Aortic root dimension: 11mm (ED). - Left atrium: The atrium was mildly dilated.  ECG: reviewed and available in electronic  records.      ASSESSMENT AND PLAN: 1. CAD s/p 4-v CABG: He remains asymptomatic. Will continue ASA and metoprolol. He still refuses to be on a statin.  2. Abnormal stress test: He is asymptomatic. Will continue to carefully monitor him for symptoms. He appears to have normal LV systolic function.  3. HTN: Controlled today on Diovan-HCT and metoprolol. 4. Hypercholesterolemia: Continue Lovaza and Niacin at this time. A lipid panel in March 2015 demonstrated total cholesterol 230, triglycerides 141, HDL  42, and LDL markedly elevated at 160. Refuses statin therapy, understanding that he is at an increased risk for a cardiovascular event. Will repeat lipids at time of next visit. 5. Leg edema: Takes Lasix 40 mg twice daily as needed. 6. Mild aortic root dilatation: Will need annual surveillance.   Dispo: f/u 6 months.  Kate Sable, M.D., F.A.C.C.

## 2014-04-07 NOTE — Patient Instructions (Signed)
Continue all current medications. Your physician wants you to follow up in: 6 months.  You will receive a reminder letter in the mail one-two months in advance.  If you don't receive a letter, please call our office to schedule the follow up appointment  Cholesterol labs just prior to next visit

## 2014-05-27 ENCOUNTER — Encounter: Payer: Self-pay | Admitting: Cardiovascular Disease

## 2014-05-28 ENCOUNTER — Encounter: Payer: Self-pay | Admitting: Cardiovascular Disease

## 2014-06-11 ENCOUNTER — Telehealth: Payer: Self-pay | Admitting: Cardiovascular Disease

## 2014-06-11 NOTE — Telephone Encounter (Signed)
Patient was discharged from [facility] 8/19 and told that he needed to have a cath done.   He followed up with Dr Wenda Overland today and was advised to come to our office to get scheduled for the CATH.  I scheduled him to see Dr Bronson Ing on Sept 10th and advised the patient if Dr Bronson Ing could schedule with out seeing him then we would.   Please call patient to advise if we can schedule CATH prior to seeing him

## 2014-06-12 ENCOUNTER — Ambulatory Visit (INDEPENDENT_AMBULATORY_CARE_PROVIDER_SITE_OTHER): Payer: Medicare PPO | Admitting: Cardiovascular Disease

## 2014-06-12 ENCOUNTER — Encounter: Payer: Self-pay | Admitting: Cardiovascular Disease

## 2014-06-12 ENCOUNTER — Other Ambulatory Visit: Payer: Self-pay | Admitting: Cardiovascular Disease

## 2014-06-12 ENCOUNTER — Telehealth: Payer: Self-pay | Admitting: Cardiovascular Disease

## 2014-06-12 ENCOUNTER — Encounter: Payer: Self-pay | Admitting: *Deleted

## 2014-06-12 VITALS — BP 119/70 | HR 45 | Ht 69.0 in | Wt 314.0 lb

## 2014-06-12 DIAGNOSIS — I255 Ischemic cardiomyopathy: Secondary | ICD-10-CM

## 2014-06-12 DIAGNOSIS — I5022 Chronic systolic (congestive) heart failure: Secondary | ICD-10-CM

## 2014-06-12 DIAGNOSIS — E7800 Pure hypercholesterolemia, unspecified: Secondary | ICD-10-CM

## 2014-06-12 DIAGNOSIS — Z87898 Personal history of other specified conditions: Secondary | ICD-10-CM

## 2014-06-12 DIAGNOSIS — I25708 Atherosclerosis of coronary artery bypass graft(s), unspecified, with other forms of angina pectoris: Secondary | ICD-10-CM

## 2014-06-12 DIAGNOSIS — Z9289 Personal history of other medical treatment: Secondary | ICD-10-CM

## 2014-06-12 DIAGNOSIS — I509 Heart failure, unspecified: Secondary | ICD-10-CM

## 2014-06-12 DIAGNOSIS — M7989 Other specified soft tissue disorders: Secondary | ICD-10-CM

## 2014-06-12 DIAGNOSIS — I25810 Atherosclerosis of coronary artery bypass graft(s) without angina pectoris: Secondary | ICD-10-CM

## 2014-06-12 DIAGNOSIS — I77810 Thoracic aortic ectasia: Secondary | ICD-10-CM

## 2014-06-12 DIAGNOSIS — I2589 Other forms of chronic ischemic heart disease: Secondary | ICD-10-CM

## 2014-06-12 DIAGNOSIS — Z951 Presence of aortocoronary bypass graft: Secondary | ICD-10-CM

## 2014-06-12 DIAGNOSIS — R9439 Abnormal result of other cardiovascular function study: Secondary | ICD-10-CM | POA: Diagnosis not present

## 2014-06-12 DIAGNOSIS — I10 Essential (primary) hypertension: Secondary | ICD-10-CM

## 2014-06-12 MED ORDER — EZETIMIBE 10 MG PO TABS: 10.0000 mg | ORAL_TABLET | Freq: Every day | ORAL | Status: AC

## 2014-06-12 NOTE — Patient Instructions (Addendum)
   Start Zetia 10mg  daily. Sent to pharmacy. Continue all other medications.   Your physician has requested that you have a cardiac catheterization. Cardiac catheterization is used to diagnose and/or treat various heart conditions. Doctors may recommend this procedure for a number of different reasons. The most common reason is to evaluate chest pain. Chest pain can be a symptom of coronary artery disease (CAD), and cardiac catheterization can show whether plaque is narrowing or blocking your heart's arteries. This procedure is also used to evaluate the valves, as well as measure the blood flow and oxygen levels in different parts of your heart. For further information please visit [email or web address]. Please follow instruction sheet, as given.  Follow up will be given at time of discharge. Your physician wants you to follow up in: 6 months.  You will receive a reminder letter in the mail one-two months in advance.  If you don't receive a letter, please call our office to schedule the follow up appointment

## 2014-06-12 NOTE — Progress Notes (Signed)
Patient ID: [name], male   DOB: [birth year], 73 y.o.   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 He underwent a Lexiscan stress test in May 2014 which was positive for myocardial scar with peri-infarct ischemia in the inferolateral segment, EF 30%.  A lipid panel in March 2015 demonstrated total cholesterol 230, triglycerides 141, HDL 42, and LDL markedly elevated at 160. Chest x-ray in February was also normal.  His legs stay chronically swollen, left more than right, for which he takes prn Lasix.   He was hospitalized at [facility] in August for angina and mild congestive heart failure. Lasix was increased to 40 mg twice daily. An echocardiogram was reportedly performed which demonstrated an ejection fraction of 50-55% with normal wall motion, mild concentric LVH, RVSP 44 mm mercury, and inferior wall hypokinesis. [facility] reportedly consulted on the patient and felt that he should undergo cardiac catheterization the near future. He was prescribed Lipitor 10 mg daily, but he never got it filled as he does not tolerate statins.  He presently denies chest pain. He may only get dyspneic with exertion if he is walking up steep hills. He has lost 18 pounds in 3 days by diet and his goal is to get to 260 pounds.  He denies orthopnea and PND. His leg swelling is "almost back to normal".  He tells me that he does not believe cholesterol is related to heart disease and that fats are in fact good for you. He has read that doctors have bought into the lie that high cholesterol is related to heart disease.  He underwent a Lexiscan stress test in May 2014 which was positive for myocardial scar with peri-infarct ischemia in the inferolateral segment.  He plans on going to [place] for 6 months and is leaving October  31.   Soc: He went on a 2 month trip to [place] earlier this year where he got married to a woman named [name] whom he was introduced to on a website called [place] [employer]. They were married on 5/28. His son went with him on this trip and also got engaged.  The patient's wife is still in [place], and [name] is seriously considering relocating there. He plans to go there for 6 months leaving on 08/09/14. While in [place], he ate one meal a day and began walking and lost 26 pounds in 3 weeks.    Review of Systems: As per "subjective", otherwise negative.  Allergies  Allergen Reactions  . Ace Inhibitors     Cough   . Sulfa Antibiotics     Rash     Current Outpatient Prescriptions  Medication Sig Dispense Refill  . allopurinol (ZYLOPRIM) 300 MG tablet Take 300 mg by mouth daily.      Marland Kitchen amoxicillin (AMOXIL) 500 MG capsule Take 500 mg by mouth as directed. Use as needed for dental infections.      Marland Kitchen aspirin 81 MG chewable tablet Chew 81 mg by mouth 2 (two) times daily.      Marland Kitchen b complex vitamins tablet Take 1 tablet by mouth daily.      . Calcium Carb-Cholecalciferol (CALCIUM + D3 PO) Take 2 tablets by mouth daily.      . Cholecalciferol (VITAMIN D-3) 5000 UNITS TABS Take 5,000 Units by mouth 2 (two) times  daily.      . furosemide (LASIX) 40 MG tablet Take 40 mg by mouth as needed.       Marland Kitchen ibuprofen (ADVIL,MOTRIN) 800 MG tablet Take 800 mg by mouth every 8 (eight) hours as needed.       Marland Kitchen MAGNESIUM PO Take 1 tablet by mouth 2 (two) times daily.      . metoprolol (LOPRESSOR) 100 MG tablet Take 100 mg by mouth 2 (two) times daily.      . niacin 500 MG tablet Take 500 mg by mouth daily with breakfast.       . omega-3 acid ethyl esters (LOVAZA) 1 G capsule Take 2 g by mouth 2 (two) times daily.      . potassium chloride (K-DUR,KLOR-CON) 10 MEQ tablet Take 10 mEq by mouth 2 (two) times daily.      . valsartan-hydrochlorothiazide (DIOVAN-HCT) 160-12.5 MG per tablet Take 1 tablet by mouth  daily.      . vitamin C (ASCORBIC ACID) 500 MG tablet Take 500 mg by mouth daily.      Marland Kitchen zolpidem (AMBIEN) 10 MG tablet Take 10 mg by mouth at bedtime as needed for sleep.       No current facility-administered medications for this visit.    Past Medical History  Diagnosis Date  . CAD (coronary artery disease)     CABG X4 vessels 04/2007  . HTN (hypertension)   . HLD (hyperlipidemia)     Past Surgical History  Procedure Laterality Date  . Colonoscopy      2003  . Coronary artery bypass graft      History   Social History  . Marital Status: Married    Spouse Name: N/A    Number of Children: N/A  . Years of Education: N/A   Occupational History  . Not on file.   Social History Main Topics  . Smoking status: Former Smoker -- 2.50 packs/day for 9 years    Types: Cigarettes    Start date: 03/31/1955    Quit date: 03/31/1963  . Smokeless tobacco: Never Used  . Alcohol Use: No  . Drug Use: No  . Sexual Activity: Not on file   Other Topics Concern  . Not on file   Social History Narrative  . No narrative on file     Filed Vitals:   06/12/14 1515  BP: 119/70  Pulse: 45  Height: 5\' 9"  (1.753 m)  Weight: 314 lb (142.429 kg)    PHYSICAL EXAM General: NAD, obese Neck: No JVD, no thyromegaly or thyroid nodule.  Lungs: Clear to auscultation bilaterally with normal respiratory effort.  CV: Nondisplaced PMI. Distant heart tones, regular rhythm, normal S1/S2, no T6/A2, II/VI pansystolic murmur heard throughout the precordium. Trace-1+ pitting pedal edema in left leg, trace periankle edema in right leg. No carotid bruit. Normal pedal pulses.  Abdomen: Soft, obese, no distention.  Skin: Intact without lesions or rashes.  Neurologic: Alert and oriented x 3.  Psych: Normal affect.  Extremities: No clubbing or cyanosis. Stasis dermatitis bilaterally (legs).  HEENT: Normal.  Skin: normal. Musculoskeletal: No gross deformities.  ECG: Most recent ECG  reviewed.      ASSESSMENT AND PLAN: 1. CAD s/p 4-v CABG: He was recently hospitalized for congestive heart failure. Prior nuclear stress testing was abnormal with inferolateral peri-infarct ischemia, and recent echocardiography demonstrates inferior wall hypokinesis. He is leaving for [place] for 6 months on October 31. He was told by the cardiology consultants Osborne Oman) at [facility] that  he needed cardiac catheterization and would like to have this done before his trip. I will arrange for left heart catheterization and coronary angiography. Will continue ASA and metoprolol. He does not want statin therapy. I will start Zetia 10 mg daily. 2. Chronic systolic heart failure: Hospitalization in August as noted above. Instructed to take Lasix bid but he takes prn. 3. Essential HTN: Controlled today on Diovan-HCT and metoprolol.  4. Hypercholesterolemia: Continue Lovaza and Niacin at this time. A lipid panel in March 2015 demonstrated total cholesterol 230, triglycerides 141, HDL 42, and LDL markedly elevated at 160. Refuses statin therapy, understanding that he is at an increased risk for a cardiovascular event. Will repeat lipids at time of next visit. Will start Zetia 10 mg daily. 5. Leg edema: Takes Lasix 40 mg twice daily as needed.  6. Mild aortic root dilatation: Will need annual surveillance.   Dispo: f/u after cath.  Time spent: 40 minutes, of which greater than 50% was spent counseling the patient on the importance of cardiovascular medications, blood pressure and cholesterol control and weight loss.  Kate Sable, M.D., F.A.C.C.

## 2014-06-12 NOTE — Telephone Encounter (Signed)
L Heart Cath 9/9 at 830 with [facility]

## 2014-06-13 ENCOUNTER — Encounter (HOSPITAL_COMMUNITY): Payer: Self-pay | Admitting: Pharmacy Technician

## 2014-06-17 NOTE — Telephone Encounter (Signed)
[employer] [facility]#[id number] exp 07-18-14

## 2014-06-18 ENCOUNTER — Ambulatory Visit (HOSPITAL_COMMUNITY)
Admission: RE | Admit: 2014-06-18 | Discharge: 2014-06-19 | Disposition: A | Discharge status: Home / Self Care | Payer: Medicare PPO | Source: Ambulatory Visit | Attending: Cardiovascular Disease | Admitting: Cardiovascular Disease

## 2014-06-18 ENCOUNTER — Encounter (HOSPITAL_COMMUNITY): Payer: Self-pay | Admitting: General Practice

## 2014-06-18 ENCOUNTER — Encounter (HOSPITAL_COMMUNITY)
Admission: RE | Disposition: A | Discharge status: Home / Self Care | Payer: Self-pay | Source: Ambulatory Visit | Attending: Cardiovascular Disease

## 2014-06-18 DIAGNOSIS — Z23 Encounter for immunization: Secondary | ICD-10-CM | POA: Diagnosis not present

## 2014-06-18 DIAGNOSIS — I359 Nonrheumatic aortic valve disorder, unspecified: Secondary | ICD-10-CM | POA: Diagnosis not present

## 2014-06-18 DIAGNOSIS — I2510 Atherosclerotic heart disease of native coronary artery without angina pectoris: Secondary | ICD-10-CM | POA: Insufficient documentation

## 2014-06-18 DIAGNOSIS — I209 Angina pectoris, unspecified: Secondary | ICD-10-CM | POA: Diagnosis present

## 2014-06-18 DIAGNOSIS — R9439 Abnormal result of other cardiovascular function study: Secondary | ICD-10-CM | POA: Diagnosis present

## 2014-06-18 DIAGNOSIS — I2582 Chronic total occlusion of coronary artery: Secondary | ICD-10-CM | POA: Diagnosis not present

## 2014-06-18 DIAGNOSIS — I10 Essential (primary) hypertension: Secondary | ICD-10-CM | POA: Insufficient documentation

## 2014-06-18 DIAGNOSIS — Z951 Presence of aortocoronary bypass graft: Secondary | ICD-10-CM

## 2014-06-18 DIAGNOSIS — E669 Obesity, unspecified: Secondary | ICD-10-CM | POA: Insufficient documentation

## 2014-06-18 DIAGNOSIS — N179 Acute kidney failure, unspecified: Secondary | ICD-10-CM

## 2014-06-18 DIAGNOSIS — I255 Ischemic cardiomyopathy: Secondary | ICD-10-CM

## 2014-06-18 DIAGNOSIS — I25708 Atherosclerosis of coronary artery bypass graft(s), unspecified, with other forms of angina pectoris: Secondary | ICD-10-CM

## 2014-06-18 DIAGNOSIS — E7800 Pure hypercholesterolemia, unspecified: Secondary | ICD-10-CM | POA: Insufficient documentation

## 2014-06-18 DIAGNOSIS — Z6841 Body Mass Index (BMI) 40.0 and over, adult: Secondary | ICD-10-CM | POA: Insufficient documentation

## 2014-06-18 DIAGNOSIS — I2584 Coronary atherosclerosis due to calcified coronary lesion: Secondary | ICD-10-CM | POA: Insufficient documentation

## 2014-06-18 DIAGNOSIS — I509 Heart failure, unspecified: Secondary | ICD-10-CM | POA: Diagnosis not present

## 2014-06-18 DIAGNOSIS — I5022 Chronic systolic (congestive) heart failure: Secondary | ICD-10-CM | POA: Diagnosis not present

## 2014-06-18 HISTORY — DX: Acute myocardial infarction, unspecified: I21.9

## 2014-06-18 HISTORY — DX: Pneumonia, unspecified organism: J18.9

## 2014-06-18 HISTORY — DX: Unspecified malignant neoplasm of skin, unspecified: C44.90

## 2014-06-18 HISTORY — DX: Primary osteoarthritis, right hand: M19.041

## 2014-06-18 HISTORY — PX: LEFT HEART CATHETERIZATION WITH CORONARY/GRAFT ANGIOGRAM: SHX5450

## 2014-06-18 HISTORY — DX: Gout, unspecified: M10.9

## 2014-06-18 HISTORY — DX: Primary osteoarthritis, left hand: M19.042

## 2014-06-18 LAB — BASIC METABOLIC PANEL
ANION GAP: 12 (ref 5–15)
BUN: 16 mg/dL (ref 6–23)
CHLORIDE: 103 meq/L (ref 96–112)
CO2: 24 meq/L (ref 19–32)
Calcium: 8.7 mg/dL (ref 8.4–10.5)
Creatinine, Ser: 1.4 mg/dL — ABNORMAL HIGH (ref 0.50–1.35)
GFR calc Af Amer: 56 mL/min — ABNORMAL LOW (ref >90)
GFR calc non Af Amer: 49 mL/min — ABNORMAL LOW (ref >90)
Glucose, Bld: 96 mg/dL (ref 70–99)
Potassium: 4.4 mEq/L (ref 3.7–5.3)
Sodium: 139 mEq/L (ref 137–147)

## 2014-06-18 LAB — CBC
HCT: 44.5 % (ref 39.0–52.0)
HEMOGLOBIN: 14.4 g/dL (ref 13.0–17.0)
MCH: 29.8 pg (ref 26.0–34.0)
MCHC: 32.4 g/dL (ref 30.0–36.0)
MCV: 91.9 fL (ref 78.0–100.0)
Platelets: 269 10*3/uL (ref 150–400)
RBC: 4.84 MIL/uL (ref 4.22–5.81)
RDW: 15.6 % — ABNORMAL HIGH (ref 11.5–15.5)
WBC: 8.6 10*3/uL (ref 4.0–10.5)

## 2014-06-18 LAB — PROTIME-INR
INR: 1.16 (ref 0.00–1.49)
Prothrombin Time: 14.8 seconds (ref 11.6–15.2)

## 2014-06-18 SURGERY — LEFT HEART CATHETERIZATION WITH CORONARY/GRAFT ANGIOGRAM
Anesthesia: LOCAL

## 2014-06-18 MED ORDER — MIDAZOLAM HCL 2 MG/2ML IJ SOLN
INTRAMUSCULAR | Status: AC
  Filled 2014-06-18: qty 2

## 2014-06-18 MED ORDER — EZETIMIBE 10 MG PO TABS
10.0000 mg | ORAL_TABLET | Freq: Every day | ORAL | Status: DC
  Administered 2014-06-18 – 2014-06-19 (×2): 10 mg via ORAL
  Filled 2014-06-18 (×2): qty 1

## 2014-06-18 MED ORDER — HYDROCHLOROTHIAZIDE 12.5 MG PO CAPS
12.5000 mg | ORAL_CAPSULE | Freq: Every day | ORAL | Status: DC
  Administered 2014-06-18 – 2014-06-19 (×2): 12.5 mg via ORAL
  Filled 2014-06-18 (×2): qty 1

## 2014-06-18 MED ORDER — FUROSEMIDE 40 MG PO TABS
40.0000 mg | ORAL_TABLET | Freq: Two times a day (BID) | ORAL | Status: DC
  Administered 2014-06-18 – 2014-06-19 (×2): 40 mg via ORAL
  Filled 2014-06-18 (×4): qty 1

## 2014-06-18 MED ORDER — NITROGLYCERIN 1 MG/10 ML FOR IR/CATH LAB
INTRA_ARTERIAL | Status: AC
  Filled 2014-06-18: qty 10

## 2014-06-18 MED ORDER — ASPIRIN 81 MG PO CHEW
CHEWABLE_TABLET | ORAL | Status: AC
  Filled 2014-06-18: qty 1

## 2014-06-18 MED ORDER — HEPARIN (PORCINE) IN NACL 2-0.9 UNIT/ML-% IJ SOLN
INTRAMUSCULAR | Status: AC
  Filled 2014-06-18: qty 1000

## 2014-06-18 MED ORDER — SODIUM CHLORIDE 0.9 % IV SOLN: 1.0000 mL/kg/h | INTRAVENOUS | Status: AC

## 2014-06-18 MED ORDER — SODIUM CHLORIDE 0.9 % IJ SOLN
3.0000 mL | Freq: Two times a day (BID) | INTRAMUSCULAR | Status: DC
Start: 2014-06-18 — End: 2014-06-19
  Administered 2014-06-18: 18:00:00 3 mL via INTRAVENOUS

## 2014-06-18 MED ORDER — SODIUM CHLORIDE 0.9 % IJ SOLN: 3.0000 mL | INTRAMUSCULAR | Status: DC | PRN

## 2014-06-18 MED ORDER — OMEGA-3-ACID ETHYL ESTERS 1 G PO CAPS
2.0000 g | ORAL_CAPSULE | Freq: Two times a day (BID) | ORAL | Status: DC
  Administered 2014-06-18 – 2014-06-19 (×3): 2 g via ORAL
  Filled 2014-06-18 (×4): qty 2

## 2014-06-18 MED ORDER — NITROGLYCERIN 0.4 MG SL SUBL: 0.4000 mg | SUBLINGUAL_TABLET | SUBLINGUAL | Status: DC | PRN

## 2014-06-18 MED ORDER — VERAPAMIL HCL 2.5 MG/ML IV SOLN
INTRAVENOUS | Status: AC
  Filled 2014-06-18: qty 2

## 2014-06-18 MED ORDER — ONDANSETRON HCL 4 MG/2ML IJ SOLN
4.0000 mg | Freq: Four times a day (QID) | INTRAMUSCULAR | Status: DC | PRN
Start: 2014-06-18 — End: 2014-06-19

## 2014-06-18 MED ORDER — SODIUM CHLORIDE 0.9 % IV SOLN: 250.0000 mL | INTRAVENOUS | Status: DC | PRN

## 2014-06-18 MED ORDER — FENTANYL CITRATE 0.05 MG/ML IJ SOLN
INTRAMUSCULAR | Status: AC
  Filled 2014-06-18: qty 2

## 2014-06-18 MED ORDER — ASPIRIN 325 MG PO TABS
325.0000 mg | ORAL_TABLET | Freq: Every day | ORAL | Status: DC
  Administered 2014-06-19: 325 mg via ORAL
  Filled 2014-06-18: qty 1

## 2014-06-18 MED ORDER — NIACIN 500 MG PO TABS
500.0000 mg | ORAL_TABLET | Freq: Every day | ORAL | Status: DC
  Administered 2014-06-19: 500 mg via ORAL
  Filled 2014-06-18 (×2): qty 1

## 2014-06-18 MED ORDER — ZOLPIDEM TARTRATE 5 MG PO TABS: 5.0000 mg | ORAL_TABLET | Freq: Every evening | ORAL | Status: DC | PRN

## 2014-06-18 MED ORDER — ALLOPURINOL 300 MG PO TABS
300.0000 mg | ORAL_TABLET | Freq: Every day | ORAL | Status: DC
  Administered 2014-06-18 – 2014-06-19 (×2): 300 mg via ORAL
  Filled 2014-06-18 (×2): qty 1

## 2014-06-18 MED ORDER — METOPROLOL TARTRATE 100 MG PO TABS
100.0000 mg | ORAL_TABLET | Freq: Two times a day (BID) | ORAL | Status: DC
  Administered 2014-06-18 – 2014-06-19 (×3): 100 mg via ORAL
  Filled 2014-06-18 (×4): qty 1

## 2014-06-18 MED ORDER — ASPIRIN 81 MG PO CHEW: 81.0000 mg | CHEWABLE_TABLET | ORAL | Status: DC

## 2014-06-18 MED ORDER — SODIUM CHLORIDE 0.9 % IJ SOLN: 3.0000 mL | Freq: Two times a day (BID) | INTRAMUSCULAR | Status: DC

## 2014-06-18 MED ORDER — INFLUENZA VAC SPLIT QUAD 0.5 ML IM SUSY
0.5000 mL | PREFILLED_SYRINGE | INTRAMUSCULAR | Status: AC
  Administered 2014-06-19: 11:00:00 0.5 mL via INTRAMUSCULAR
  Filled 2014-06-18: qty 0.5

## 2014-06-18 MED ORDER — LIDOCAINE HCL (PF) 1 % IJ SOLN
INTRAMUSCULAR | Status: AC
  Filled 2014-06-18: qty 30

## 2014-06-18 MED ORDER — ACETAMINOPHEN 325 MG PO TABS: 650.0000 mg | ORAL_TABLET | ORAL | Status: DC | PRN

## 2014-06-18 MED ORDER — SODIUM CHLORIDE 0.9 % IV SOLN
INTRAVENOUS | Status: DC
  Administered 2014-06-18: 1000 mL via INTRAVENOUS

## 2014-06-18 MED ORDER — IRBESARTAN 150 MG PO TABS
150.0000 mg | ORAL_TABLET | Freq: Every day | ORAL | Status: DC
Start: 2014-06-18 — End: 2014-06-19
  Administered 2014-06-18 – 2014-06-19 (×2): 150 mg via ORAL
  Filled 2014-06-18 (×2): qty 1

## 2014-06-18 MED ORDER — VALSARTAN-HYDROCHLOROTHIAZIDE 160-12.5 MG PO TABS: 1.0000 | ORAL_TABLET | Freq: Every day | ORAL | Status: DC

## 2014-06-18 MED ORDER — OXYCODONE-ACETAMINOPHEN 5-325 MG PO TABS: 1.0000 | ORAL_TABLET | ORAL | Status: DC | PRN

## 2014-06-18 MED ORDER — HEPARIN SODIUM (PORCINE) 1000 UNIT/ML IJ SOLN
INTRAMUSCULAR | Status: AC
  Filled 2014-06-18: qty 1

## 2014-06-18 MED ORDER — POTASSIUM CHLORIDE CRYS ER 10 MEQ PO TBCR
10.0000 meq | EXTENDED_RELEASE_TABLET | Freq: Two times a day (BID) | ORAL | Status: DC
  Administered 2014-06-18 – 2014-06-19 (×3): 10 meq via ORAL
  Filled 2014-06-18 (×4): qty 1

## 2014-06-18 NOTE — H&P (View-Only) (Signed)
Patient ID: [name], male   DOB: [birth year], 73 y.o.   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 He underwent a Lexiscan stress test in May 2014 which was positive for myocardial scar with peri-infarct ischemia in the inferolateral segment, EF 30%.  A lipid panel in March 2015 demonstrated total cholesterol 230, triglycerides 141, HDL 42, and LDL markedly elevated at 160. Chest x-ray in February was also normal.  His legs stay chronically swollen, left more than right, for which he takes prn Lasix.   He was hospitalized at [name] in August for angina and mild congestive heart failure. Lasix was increased to 40 mg twice daily. An echocardiogram was reportedly performed which demonstrated an ejection fraction of 50-55% with normal wall motion, mild concentric LVH, RVSP 44 mm mercury, and inferior wall hypokinesis. [name] reportedly consulted on the patient and felt that he should undergo cardiac catheterization the near future. He was prescribed Lipitor 10 mg daily, but he never got it filled as he does not tolerate statins.  He presently denies chest pain. He may only get dyspneic with exertion if he is walking up steep hills. He has lost 18 pounds in 3 days by diet and his goal is to get to 260 pounds.  He denies orthopnea and PND. His leg swelling is "almost back to normal".  He tells me that he does not believe cholesterol is related to heart disease and that fats are in fact good for you. He has read that doctors have bought into the lie that high cholesterol is related to heart disease.  He underwent a Lexiscan stress test in May 2014 which was positive for myocardial scar with peri-infarct ischemia in the inferolateral segment.  He plans on going to [place] for 6 months and is leaving October  31.   Soc: He went on a 2 month trip to [place] earlier this year where he got married to a woman named [name] whom he was introduced to on a website called [place] [employer]. They were married on 5/28. His son went with him on this trip and also got engaged.  The patient's wife is still in [place], and [name] is seriously considering relocating there. He plans to go there for 6 months leaving on 72/31/15. While in [place], he ate one meal a day and began walking and lost 26 pounds in 3 weeks.    Review of Systems: As per "subjective", otherwise negative.  Allergies  Allergen Reactions  . Ace Inhibitors     Cough   . Sulfa Antibiotics     Rash     Current Outpatient Prescriptions  Medication Sig Dispense Refill  . allopurinol (ZYLOPRIM) 300 MG tablet Take 300 mg by mouth daily.      Marland Kitchen amoxicillin (AMOXIL) 500 MG capsule Take 500 mg by mouth as directed. Use as needed for dental infections.      Marland Kitchen aspirin 81 MG chewable tablet Chew 81 mg by mouth 2 (two) times daily.      Marland Kitchen b complex vitamins tablet Take 1 tablet by mouth daily.      . Calcium Carb-Cholecalciferol (CALCIUM + D3 PO) Take 2 tablets by mouth daily.      . Cholecalciferol (VITAMIN D-3) 5000 UNITS TABS Take 5,000 Units by mouth 2 (two) times  daily.      . furosemide (LASIX) 40 MG tablet Take 40 mg by mouth as needed.       Marland Kitchen ibuprofen (ADVIL,MOTRIN) 800 MG tablet Take 800 mg by mouth every 8 (eight) hours as needed.       Marland Kitchen MAGNESIUM PO Take 1 tablet by mouth 2 (two) times daily.      . metoprolol (LOPRESSOR) 100 MG tablet Take 100 mg by mouth 2 (two) times daily.      . niacin 500 MG tablet Take 500 mg by mouth daily with breakfast.       . omega-3 acid ethyl esters (LOVAZA) 1 G capsule Take 2 g by mouth 2 (two) times daily.      . potassium chloride (K-DUR,KLOR-CON) 10 MEQ tablet Take 10 mEq by mouth 2 (two) times daily.      . valsartan-hydrochlorothiazide (DIOVAN-HCT) 160-12.5 MG per tablet Take 1 tablet by mouth  daily.      . vitamin C (ASCORBIC ACID) 500 MG tablet Take 500 mg by mouth daily.      Marland Kitchen zolpidem (AMBIEN) 10 MG tablet Take 10 mg by mouth at bedtime as needed for sleep.       No current facility-administered medications for this visit.    Past Medical History  Diagnosis Date  . CAD (coronary artery disease)     CABG X4 vessels 04/2007  . HTN (hypertension)   . HLD (hyperlipidemia)     Past Surgical History  Procedure Laterality Date  . Colonoscopy      2003  . Coronary artery bypass graft      History   Social History  . Marital Status: Married    Spouse Name: N/A    Number of Children: N/A  . Years of Education: N/A   Occupational History  . Not on file.   Social History Main Topics  . Smoking status: Former Smoker -- 2.50 packs/day for 9 years    Types: Cigarettes    Start date: 03/31/1955    Quit date: 03/31/1963  . Smokeless tobacco: Never Used  . Alcohol Use: No  . Drug Use: No  . Sexual Activity: Not on file   Other Topics Concern  . Not on file   Social History Narrative  . No narrative on file     Filed Vitals:   06/12/14 1515  BP: 119/70  Pulse: 45  Height: 5\' 9"  (1.753 m)  Weight: 314 lb (142.429 kg)    PHYSICAL EXAM General: NAD, obese Neck: No JVD, no thyromegaly or thyroid nodule.  Lungs: Clear to auscultation bilaterally with normal respiratory effort.  CV: Nondisplaced PMI. Distant heart tones, regular rhythm, normal S1/S2, no L3/Y1, II/VI pansystolic murmur heard throughout the precordium. Trace-1+ pitting pedal edema in left leg, trace periankle edema in right leg. No carotid bruit. Normal pedal pulses.  Abdomen: Soft, obese, no distention.  Skin: Intact without lesions or rashes.  Neurologic: Alert and oriented x 3.  Psych: Normal affect.  Extremities: No clubbing or cyanosis. Stasis dermatitis bilaterally (legs).  HEENT: Normal.  Skin: normal. Musculoskeletal: No gross deformities.  ECG: Most recent ECG  reviewed.      ASSESSMENT AND PLAN: 1. CAD s/p 4-v CABG: He was recently hospitalized for congestive heart failure. Prior nuclear stress testing was abnormal with inferolateral peri-infarct ischemia, and recent echocardiography demonstrates inferior wall hypokinesis. He is leaving for [place] for 6 months on October 31. He was told by the cardiology consultants Osborne Oman) at [facility] that  he needed cardiac catheterization and would like to have this done before his trip. I will arrange for left heart catheterization and coronary angiography. Will continue ASA and metoprolol. He does not want statin therapy. I will start Zetia 10 mg daily. 2. Chronic systolic heart failure: Hospitalization in August as noted above. Instructed to take Lasix bid but he takes prn. 3. Essential HTN: Controlled today on Diovan-HCT and metoprolol.  4. Hypercholesterolemia: Continue Lovaza and Niacin at this time. A lipid panel in March 2015 demonstrated total cholesterol 230, triglycerides 141, HDL 42, and LDL markedly elevated at 160. Refuses statin therapy, understanding that he is at an increased risk for a cardiovascular event. Will repeat lipids at time of next visit. Will start Zetia 10 mg daily. 5. Leg edema: Takes Lasix 40 mg twice daily as needed.  6. Mild aortic root dilatation: Will need annual surveillance.   Dispo: f/u after cath.  Time spent: 40 minutes, of which greater than 50% was spent counseling the patient on the importance of cardiovascular medications, blood pressure and cholesterol control and weight loss.  Kate Sable, M.D., F.A.C.C.

## 2014-06-18 NOTE — Interval H&P Note (Signed)
History and Physical Interval Note:  06/18/2014 8:47 AM  Bernell List  has presented today for surgery, with the diagnosis of abnormal stress test, chf, angina  The various methods of treatment have been discussed with the patient and family. After consideration of risks, benefits and other options for treatment, the patient has consented to  Procedure(s): LEFT HEART CATHETERIZATION WITH CORONARY/GRAFT ANGIOGRAM (N/A) as a surgical intervention .  The patient's history has been reviewed, patient examined, no change in status, stable for surgery.  I have reviewed the patient's chart and labs.  Questions were answered to the patient's satisfaction.    Cath Lab Visit (complete for each Cath Lab visit)  Clinical Evaluation Leading to the Procedure:   ACS: No.  Non-ACS:    Anginal Classification: CCS III  Anti-ischemic medical therapy: Minimal Therapy (1 class of medications)  Non-Invasive Test Results: High-risk stress test findings: cardiac mortality >3%/year  Prior CABG: Previous CABG       Sherren Mocha

## 2014-06-18 NOTE — Progress Notes (Signed)
Instructions reviewed w/patient 

## 2014-06-18 NOTE — Progress Notes (Signed)
TR BAND REMOVAL  LOCATION:    left radial  DEFLATED PER PROTOCOL:    Yes.    TIME BAND OFF / DRESSING APPLIED:    1330   SITE UPON ARRIVAL:    Level 0  SITE AFTER BAND REMOVAL:    Level 0  REVERSE ALLEN'S TEST:     positive  CIRCULATION SENSATION AND MOVEMENT:    Within Normal Limits   Yes.    COMMENTS:   Tolerated procedure well

## 2014-06-18 NOTE — CV Procedure (Signed)
Cardiac Catheterization Procedure Note  Name: [name] MRN: [id number] DOB: 1941-06-05  Procedure: Catheter placement for coronary angiography, Selective Coronary Angiography, LIMA angiography, saphenous vein graft angiography  Indication: Known coronary artery disease status post CABG, progressive CCS class 2-3 anginal symptoms(dyspnea is anginal equivalent), abnormal nuclear scan with inferior defect and peri-infarct ischemia with severely reduced LVEF by Myoview.   Procedural Details: The left wrist was prepped, draped, and anesthetized with 1% lidocaine. Using the modified Seldinger technique, a 5/6 French Slender sheath was introduced into the left radial artery. 3 mg of verapamil was administered through the sheath, weight-based unfractionated heparin was administered intravenously. Standard Judkins catheters were used for selective coronary angiography. A LIMA catheter was used for LIMA angiography, an AL-1 catheter was used for saphenous vein graft angiography of the diagonal and intermediate grafts. A multipurpose catheter was used for saphenous vein graft angiography of the right coronary graft. I tried to cross the aortic valve with a pigtail catheter, guidewire, and multipurpose catheter without success. This was likely due to mild aortic stenosis and tortuosity of the subclavian artery. Catheter exchanges were performed over an exchange length guidewire. There were no immediate procedural complications. A TR band was used for radial hemostasis at the completion of the procedure.  The patient was transferred to the post catheterization recovery area for further monitoring.  Procedural Findings: Hemodynamics: AO 117/71  Coronary angiography: Coronary dominance: right  Left mainstem: The left main stem is moderately calcified. The vessel is patent with mild nonobstructive disease present.  Left anterior descending (LAD): The proximal LAD has 90% stenosis. This extends into a  large septal perforating cascade which supplies collaterals to the distal RCA territory. The proximal LAD just after the first septal perforator is totally occluded.  Left circumflex (LCx): The left circumflex has moderate 70-80% stenosis proximally then fills from graft flow beyond that area of stenosis. The AV circumflex is heavily calcified.  Right coronary artery (RCA): The right coronary artery is severely calcified. The vessel is totally occluded in its midportion.  LIMA to LAD: Widely patent throughout. The distal anastomotic site is patent. The mid LAD beyond the graft insertion site as mild nonobstructive disease with 30-40% stenosis. There is a collateral provided to the distal RCA territory.  Saphenous vein graft to first diagonal: Patent throughout. The mid body of the graft has 50% stenosis. The remaining portions of the vein graft are patent with no obstructive disease. The distal anastomotic site is patent.  Saphenous vein graft to intermediate branch: Patent throughout. The intermediate is patent without high-grade obstructive disease.  Saphenous vein graft to PLA: This graft is degenerated but without high-grade obstructive disease. There are scattered areas of 40-50% stenosis noted. The distal anastomotic site is widely patent.  Left ventriculography: Not performed  Contrast: 135 cc Omnipaque  Estimated Blood Loss: Minimal  Final Conclusions:   1. Severe native three-vessel coronary artery disease with severe left circumflex stenosis, total occlusion of the LAD, and total occlusion of the RCA 2. Status post aortocoronary bypass surgery with continued patency of the LIMA to LAD, saphenous vein graft to intermediate, saphenous vein graft to diagonal, and saphenous vein graft to right posterolateral  Recommendations: The patient has continued patency of his bypass grafts. There are some collateral supplying flow to the right PDA. I suspect this accounts for his inferior defect  with peri-infarct ischemia. I would recommend ongoing medical therapy. His LV function was only mildly depressed with an EF of 50-55% by  echo. He did have mild aortic stenosis noted. I was unable to cross his aortic valve during this procedure with standard technique. Probably should be followed up with serial echo studies.  Sherren Mocha MD, [facility] 06/18/2014, 9:59 AM

## 2014-06-19 ENCOUNTER — Encounter (HOSPITAL_COMMUNITY): Payer: Self-pay | Admitting: Physician Assistant

## 2014-06-19 ENCOUNTER — Other Ambulatory Visit: Payer: Self-pay | Admitting: Physician Assistant

## 2014-06-19 ENCOUNTER — Encounter: Payer: Medicare PPO | Admitting: Cardiovascular Disease

## 2014-06-19 DIAGNOSIS — N179 Acute kidney failure, unspecified: Secondary | ICD-10-CM

## 2014-06-19 DIAGNOSIS — I2510 Atherosclerotic heart disease of native coronary artery without angina pectoris: Secondary | ICD-10-CM | POA: Diagnosis not present

## 2014-06-19 DIAGNOSIS — R9439 Abnormal result of other cardiovascular function study: Secondary | ICD-10-CM

## 2014-06-19 NOTE — Discharge Summary (Signed)
Physician Discharge Summary      Patient ID: [name] MRN: [id number] DOB/AGE: 1941/05/03 73 y.o.  Admit date: 06/18/2014 Discharge date: 06/19/2014  Admission Diagnoses:   Other and unspecified angina pectoris  Discharge Diagnoses:  Active Problems:   S/P CABG x 4   HTN (hypertension)   Hypercholesteremia   Other and unspecified angina pectoris   AKI (acute kidney injury)   Discharged Condition: stable  Hospital Course:   Mr. Gwynne is a 73 yr old obese male with a past medical history significant for an MI (primary symptoms being shortness of breath) and 4-vessel CABG in July 2008, HTN, and hypercholesterolemia. He underwent a Lexiscan stress test in May 2014 which was positive for myocardial scar with peri-infarct ischemia in the inferolateral segment, EF 30%.  A lipid panel in March 2015 demonstrated total cholesterol 230, triglycerides 141, HDL 42, and LDL markedly elevated at 160. Chest x-ray in February was also normal.  His legs stay chronically swollen, left more than right, for which he takes prn Lasix.   He was hospitalized at [facility] in August for angina and mild congestive heart failure. Lasix was increased to 40 mg twice daily. An echocardiogram revealed an ejection fraction of 50-55% with normal wall motion, mild concentric LVH, RVSP 44 mm mercury, and inferior wall hypokinesis. [facility] reportedly consulted on the patient and felt that he should undergo cardiac catheterization in the near future. He was prescribed Lipitor 10 mg daily, but he never got it filled as he does not tolerate statins.   He was seen by Dr. Jacinta Shoe on 06/12/14 and denied chest pain. He may only get dyspneic with exertion if he is walking up steep hills. He has lost 18 pounds in 3 days by diet and his goal is to get to 260 pounds.   He used to weight 400# and is now 314. He denies orthopnea and PND. His leg swelling is "almost back to normal".   He told Dr. Jacinta Shoe that he does  not believe cholesterol is related to heart disease and that fats are in fact good for you. He has read that doctors have bought into the lie that high cholesterol is related to heart disease.  He underwent a Lexiscan stress test in May 2014 which was positive for myocardial scar with peri-infarct ischemia in the inferolateral segment.  He plans on going to [place] for 6 months and is leaving October 31. The patient was seen by Dr. Burt Knack who felt she was stable for DC home.  He was scheduled for and underwent a left heart cath which revealed severe native three-vessel coronary artery disease with severe left circumflex stenosis, total occlusion of the LAD, and total occlusion of the RCA. Status post aortocoronary bypass surgery with continued patency of the LIMA to LAD, saphenous vein graft to intermediate, saphenous vein graft to diagonal, and saphenous vein graft to right posterolateral. Continue med therapy. EF 50-55%. Mild AI and AS. Mild to mod MR.  He had one episode of NSVT for five beats.  He is on lopressor 100mg  bid.  SCr is elevated to 1.40.  We will recheck a BMET in one week.  We encouraged continued weight loss. The patient was seen by Dr. Burt Knack who felt he was stable for DC home.    Consults: None  Significant Diagnostic Studies:  Cardiac Catheterization Procedure Note  Name: PHILANDER AKE  MRN: [id number]  DOB: [birth year]  Procedure: Catheter placement for coronary angiography, Selective Coronary Angiography,  LIMA angiography, saphenous vein graft angiography  Indication: Known coronary artery disease status post CABG, progressive CCS class 2-3 anginal symptoms(dyspnea is anginal equivalent), abnormal nuclear scan with inferior defect and peri-infarct ischemia with severely reduced LVEF by Myoview.  Procedural Details: The left wrist was prepped, draped, and anesthetized with 1% lidocaine. Using the modified Seldinger technique, a 5/6 French Slender sheath was introduced into the left  radial artery. 3 mg of verapamil was administered through the sheath, weight-based unfractionated heparin was administered intravenously. Standard Judkins catheters were used for selective coronary angiography. A LIMA catheter was used for LIMA angiography, an AL-1 catheter was used for saphenous vein graft angiography of the diagonal and intermediate grafts. A multipurpose catheter was used for saphenous vein graft angiography of the right coronary graft. I tried to cross the aortic valve with a pigtail catheter, guidewire, and multipurpose catheter without success. This was likely due to mild aortic stenosis and tortuosity of the subclavian artery. Catheter exchanges were performed over an exchange length guidewire. There were no immediate procedural complications. A TR band was used for radial hemostasis at the completion of the procedure. The patient was transferred to the post catheterization recovery area for further monitoring.  Procedural Findings:  Hemodynamics:  AO 117/71  Coronary angiography:  Coronary dominance: right  Left mainstem: The left main stem is moderately calcified. The vessel is patent with mild nonobstructive disease present.  Left anterior descending (LAD): The proximal LAD has 90% stenosis. This extends into a large septal perforating cascade which supplies collaterals to the distal RCA territory. The proximal LAD just after the first septal perforator is totally occluded.  Left circumflex (LCx): The left circumflex has moderate 70-80% stenosis proximally then fills from graft flow beyond that area of stenosis. The AV circumflex is heavily calcified.  Right coronary artery (RCA): The right coronary artery is severely calcified. The vessel is totally occluded in its midportion.  LIMA to LAD: Widely patent throughout. The distal anastomotic site is patent. The mid LAD beyond the graft insertion site as mild nonobstructive disease with 30-40% stenosis. There is a collateral  provided to the distal RCA territory.  Saphenous vein graft to first diagonal: Patent throughout. The mid body of the graft has 50% stenosis. The remaining portions of the vein graft are patent with no obstructive disease. The distal anastomotic site is patent.  Saphenous vein graft to intermediate branch: Patent throughout. The intermediate is patent without high-grade obstructive disease.  Saphenous vein graft to PLA: This graft is degenerated but without high-grade obstructive disease. There are scattered areas of 40-50% stenosis noted. The distal anastomotic site is widely patent.  Left ventriculography: Not performed  Contrast: 135 cc Omnipaque  Estimated Blood Loss: Minimal  Final Conclusions:  1. Severe native three-vessel coronary artery disease with severe left circumflex stenosis, total occlusion of the LAD, and total occlusion of the RCA  2. Status post aortocoronary bypass surgery with continued patency of the LIMA to LAD, saphenous vein graft to intermediate, saphenous vein graft to diagonal, and saphenous vein graft to right posterolateral  Recommendations: The patient has continued patency of his bypass grafts. There are some collateral supplying flow to the right PDA. I suspect this accounts for his inferior defect with peri-infarct ischemia. I would recommend ongoing medical therapy. His LV function was only mildly depressed with an EF of 50-55% by echo. He did have mild aortic stenosis noted. I was unable to cross his aortic valve during this procedure with standard technique. Probably should  be followed up with serial echo studies.  Sherren Mocha MD, [facility]  06/18/2014, 9:59 AM   BMET    Component Value Date/Time   NA 139 06/18/2014 0700   K 4.4 06/18/2014 0700   CL 103 06/18/2014 0700   CO2 24 06/18/2014 0700   GLUCOSE 96 06/18/2014 0700   BUN 16 06/18/2014 0700   CREATININE 1.40* 06/18/2014 0700   CALCIUM 8.7 06/18/2014 0700   GFRNONAA 49* 06/18/2014 0700   GFRAA 56* 06/18/2014 0700      Treatments:  See above  Discharge Exam: Blood pressure 140/81, pulse 79, temperature 98.4 F (36.9 C), temperature source Oral, resp. rate 20, height 5\' 9"  (1.753 m), weight 314 lb (142.429 kg), SpO2 100.00%.   Disposition:   Discharge Instructions   Diet - low sodium heart healthy    Complete by:  As directed      Discharge instructions    Complete by:  As directed   No lifting with left arm for three days.     Increase activity slowly    Complete by:  As directed             Medication List         allopurinol 300 MG tablet  Commonly known as:  ZYLOPRIM  Take 300 mg by mouth daily.     amoxicillin 500 MG capsule  Commonly known as:  AMOXIL  Take 500 mg by mouth as directed. Use as needed for dental infections.     aspirin 325 MG tablet  Take 325 mg by mouth daily.     b complex vitamins tablet  Take 1 tablet by mouth daily.     CALCIUM + D3 PO  Take 2 tablets by mouth daily.     ezetimibe 10 MG tablet  Commonly known as:  ZETIA  Take 1 tablet (10 mg total) by mouth daily.     furosemide 40 MG tablet  Commonly known as:  LASIX  Take 40 mg by mouth 2 (two) times daily.     ibuprofen 800 MG tablet  Commonly known as:  ADVIL,MOTRIN  Take 800 mg by mouth every 8 (eight) hours as needed.     MAGNESIUM PO  Take 1 tablet by mouth 2 (two) times daily.     metoprolol 100 MG tablet  Commonly known as:  LOPRESSOR  Take 100 mg by mouth 2 (two) times daily.     niacin 500 MG tablet  Take 500 mg by mouth daily with breakfast.     nitroGLYCERIN 0.4 MG SL tablet  Commonly known as:  NITROSTAT  Place 0.4 mg under the tongue every 5 (five) minutes as needed for chest pain.     omega-3 acid ethyl esters 1 G capsule  Commonly known as:  LOVAZA  Take 2 g by mouth 2 (two) times daily.     potassium chloride 10 MEQ tablet  Commonly known as:  K-DUR,KLOR-CON  Take 10 mEq by mouth 2 (two) times daily.     TESTOSTERONE IM  Inject into the muscle. Gets a shot  every 2 weeks     valsartan-hydrochlorothiazide 160-12.5 MG per tablet  Commonly known as:  DIOVAN-HCT  Take 1 tablet by mouth daily.     vitamin C 500 MG tablet  Commonly known as:  ASCORBIC ACID  Take 500 mg by mouth daily.     Vitamin D-3 5000 UNITS Tabs  Take 5,000 Units by mouth 2 (two) times daily.  zolpidem 10 MG tablet  Commonly known as:  AMBIEN  Take 10 mg by mouth at bedtime as needed for sleep.           Follow-up Information   Follow up with Herminio Commons, MD On 07/09/2014. (11:20AM)    Specialty:  Cardiology   Contact information:   [place] [address] [phone]       Follow up with LAB[facility] On 06/25/2014. (Go to lab of choice for a Basic Metabloic Panel to check kidney function)      Greater than 30 minutes was spent completing the patient's discharge.    SignedTarri Fuller, [place] 06/19/2014, 11:10 AM

## 2014-06-19 NOTE — Progress Notes (Signed)
Subjective: No CP or SOB  Objective: Vital signs in last 24 hours: Temp:  [97.9 F (36.6 C)-99.1 F (37.3 C)] 98.4 F (36.9 C) (09/10 0755) Pulse Rate:  [66-79] 79 (09/10 0755) Resp:  [14-20] 20 (09/10 0755) BP: (100-140)/(49-81) 140/81 mmHg (09/10 0755) SpO2:  [95 %-100 %] 100 % (09/10 0755) Last BM Date: 06/18/14  Intake/Output from previous day: 09/09 0701 - 09/10 0700 In: 1080 [P.O.:1080] Out: 2350 [Urine:2350] Intake/Output this shift: Total I/O In: 360 [P.O.:360] Out: -   Medications Current Facility-Administered Medications  Medication Dose Route Frequency Provider Last Rate Last Dose  . 0.9 %  sodium chloride infusion  250 mL Intravenous PRN Sherren Mocha, MD      . acetaminophen (TYLENOL) tablet 650 mg  650 mg Oral Q4H PRN Sherren Mocha, MD      . allopurinol (ZYLOPRIM) tablet 300 mg  300 mg Oral Daily Sherren Mocha, MD   300 mg at 06/18/14 1353  . aspirin tablet 325 mg  325 mg Oral Daily Sherren Mocha, MD      . ezetimibe (ZETIA) tablet 10 mg  10 mg Oral Daily Sherren Mocha, MD   10 mg at 06/18/14 1348  . furosemide (LASIX) tablet 40 mg  40 mg Oral BID Sherren Mocha, MD   40 mg at 06/18/14 1814  . hydrochlorothiazide (MICROZIDE) capsule 12.5 mg  12.5 mg Oral Daily Sherren Mocha, MD   12.5 mg at 06/18/14 1349  . Influenza vac split quadrivalent PF (FLUARIX) injection 0.5 mL  0.5 mL Intramuscular Tomorrow-1000 Sherren Mocha, MD      . irbesartan (AVAPRO) tablet 150 mg  150 mg Oral Daily Sherren Mocha, MD   150 mg at 06/18/14 1350  . metoprolol (LOPRESSOR) tablet 100 mg  100 mg Oral BID Sherren Mocha, MD   100 mg at 06/18/14 2129  . niacin tablet 500 mg  500 mg Oral Q breakfast Sherren Mocha, MD      . nitroGLYCERIN (NITROSTAT) SL tablet 0.4 mg  0.4 mg Sublingual Q5 min PRN Sherren Mocha, MD      . omega-3 acid ethyl esters (LOVAZA) capsule 2 g  2 g Oral BID Sherren Mocha, MD   2 g at 06/18/14 2129  . ondansetron (ZOFRAN) injection 4 mg  4 mg  Intravenous Q6H PRN Sherren Mocha, MD      . oxyCODONE-acetaminophen (PERCOCET/ROXICET) 5-325 MG per tablet 1-2 tablet  1-2 tablet Oral Q4H PRN Sherren Mocha, MD      . potassium chloride (K-DUR,KLOR-CON) CR tablet 10 mEq  10 mEq Oral BID Sherren Mocha, MD   10 mEq at 06/18/14 2129  . sodium chloride 0.9 % injection 3 mL  3 mL Intravenous Q12H Sherren Mocha, MD   3 mL at 06/18/14 1819  . sodium chloride 0.9 % injection 3 mL  3 mL Intravenous PRN Sherren Mocha, MD      . zolpidem [facility]) tablet 5 mg  5 mg Oral QHS PRN Sherren Mocha, MD        PE: General appearance: alert, cooperative and no distress Lungs: clear to auscultation bilaterally Heart: regular rate and rhythm and 2/6 sys MM LSB Extremities: No LEE Pulses: 2+ and symmetric Skin: Warm and dry.  left wrist:  Tiny amount of eccymosis distal to the cath site.  Neurologic: Grossly normal  Lab Results:   Recent Labs  06/18/14 0700  WBC 8.6  HGB 14.4  HCT 44.5  PLT 269   BMET  Recent Labs  06/18/14  0700  NA 139  K 4.4  CL 103  CO2 24  GLUCOSE 96  BUN 16  CREATININE 1.40*  CALCIUM 8.7   PT/INR  Recent Labs  06/18/14 0700  LABPROT 14.8  INR 1.16     Assessment/Plan   Active Problems:   S/P CABG x 4   HTN (hypertension)   Hypercholesteremia   Other and unspecified angina pectoris   NSVT   Obesity   Plan: SP LHC revealing severe native three-vessel coronary artery disease with severe left circumflex stenosis, total occlusion of the LAD, and total occlusion of the RCA. Status post aortocoronary bypass surgery with continued patency of the LIMA to LAD, saphenous vein graft to intermediate, saphenous vein graft to diagonal, and saphenous vein graft to right posterolateral. Continue med therapy.  EF 50-55%.  Mild AI and AS.  Mild to mod MR.    NSVT:  5 beats x 1.  On lopressor 100mg  BID.   SCr 1.40.  No prior value to compare.  Check BMET in a week.  BP and HR stable.    Encouraged continued  weight loss.  He used to be 400.  He is planning on going back to [place] at the end of Oct.    LOS: 1 day    HAGER, BRYAN PA-C 06/19/2014 8:14 AM  Patient seen, examined. Available data reviewed. Agree with findings, assessment, and plan as outlined by Tarri Fuller, PA-C. The patient's left radial site is clear without evidence of hematoma or ecchymoses. Heart is regular rate and rhythm with a grade 2/6 early to mid peaking systolic crescendo decrescendo murmur carotid per sternal border. I reviewed his catheterization results with him once again. Medical therapy will be continued. I encouraged him to work on weight loss, dietary modification, and exercise. He will follow-up with Dr Bronson Ing.   Sherren Mocha, M.D. 06/19/2014 9:39 AM

## 2014-06-19 NOTE — Progress Notes (Signed)
Small burst NSVT at 03:34 5 beats patient sleeping a symptomatic.

## 2014-07-09 ENCOUNTER — Encounter: Payer: Self-pay | Admitting: Cardiovascular Disease

## 2014-07-09 ENCOUNTER — Ambulatory Visit (INDEPENDENT_AMBULATORY_CARE_PROVIDER_SITE_OTHER): Payer: Medicare PPO | Admitting: Cardiovascular Disease

## 2014-07-09 VITALS — BP 145/75 | HR 63 | Ht 69.0 in | Wt 331.2 lb

## 2014-07-09 DIAGNOSIS — I10 Essential (primary) hypertension: Secondary | ICD-10-CM | POA: Diagnosis not present

## 2014-07-09 DIAGNOSIS — I25810 Atherosclerosis of coronary artery bypass graft(s) without angina pectoris: Secondary | ICD-10-CM

## 2014-07-09 DIAGNOSIS — E7800 Pure hypercholesterolemia, unspecified: Secondary | ICD-10-CM | POA: Diagnosis not present

## 2014-07-09 DIAGNOSIS — I77810 Thoracic aortic ectasia: Secondary | ICD-10-CM

## 2014-07-09 DIAGNOSIS — I255 Ischemic cardiomyopathy: Secondary | ICD-10-CM

## 2014-07-09 DIAGNOSIS — I5022 Chronic systolic (congestive) heart failure: Secondary | ICD-10-CM

## 2014-07-09 DIAGNOSIS — I350 Nonrheumatic aortic (valve) stenosis: Secondary | ICD-10-CM

## 2014-07-09 DIAGNOSIS — I359 Nonrheumatic aortic valve disorder, unspecified: Secondary | ICD-10-CM

## 2014-07-09 DIAGNOSIS — I2589 Other forms of chronic ischemic heart disease: Secondary | ICD-10-CM

## 2014-07-09 DIAGNOSIS — M7989 Other specified soft tissue disorders: Secondary | ICD-10-CM

## 2014-07-09 MED ORDER — ASPIRIN EC 81 MG PO TBEC: 81.0000 mg | DELAYED_RELEASE_TABLET | Freq: Every day | ORAL | Status: AC
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
## 2014-07-09 NOTE — Patient Instructions (Signed)
Your physician recommends that you schedule a follow-up appointment in: 6 months. You will receive a reminder letter in the mail in about 4 months reminding you to call and schedule your appointment. If you don't receive this letter, please contact our office. Your physician has recommended you make the following change in your medication:  Decrease your aspirin to 81 mg daily. Continue all other medications the same.

## 2014-09-18 ENCOUNTER — Encounter (HOSPITAL_COMMUNITY): Payer: Self-pay | Admitting: Cardiovascular Disease

## 2016-03-23 ENCOUNTER — Encounter (HOSPITAL_COMMUNITY)
Admission: RE | Admit: 2016-03-23 | Discharge: 2016-03-23 | Disposition: A | Discharge status: Home / Self Care | Payer: Medicare HMO | Source: Ambulatory Visit | Attending: Urology | Admitting: Urology

## 2016-03-23 DIAGNOSIS — R948 Abnormal results of function studies of other organs and systems: Secondary | ICD-10-CM | POA: Insufficient documentation

## 2016-03-23 LAB — HEMOGLOBIN AND HEMATOCRIT, BLOOD
HCT: 49.7 % (ref 39.0–52.0)
Hemoglobin: 16.6 g/dL (ref 13.0–17.0)

## 2016-03-23 NOTE — Progress Notes (Addendum)
Bernell List presents today for phlebotomy per MD orders. HGB/HCT: no recent results available Phlebotomy procedure started at 0825 and ended at 0834. 16.5 oz  removed. Patient tolerated procedure well. IV needle removed intact. Hemoglobin/Hematocrit obtained post phlebotomy. Results for KAS, SHYNE (MRN [id number]) as of 03/23/2016 10:31  Ref. Range 03/23/2016 09:16  Hemoglobin Latest Ref Range: 13.0-17.0 g/dL 16.6  HCT Latest Ref Range: 39.0-52.0 % 49.7

## 2016-05-04 ENCOUNTER — Encounter (HOSPITAL_COMMUNITY): Payer: Medicare HMO

## 2016-05-04 ENCOUNTER — Other Ambulatory Visit (HOSPITAL_COMMUNITY): Payer: Medicare HMO

## 2016-05-04 ENCOUNTER — Encounter (HOSPITAL_COMMUNITY)
Admission: RE | Admit: 2016-05-04 | Discharge: 2016-05-04 | Disposition: A | Discharge status: Home / Self Care | Payer: Medicare HMO | Source: Ambulatory Visit | Attending: Urology | Admitting: Urology

## 2016-05-04 DIAGNOSIS — D751 Secondary polycythemia: Secondary | ICD-10-CM | POA: Insufficient documentation

## 2016-05-04 LAB — HEMOGLOBIN AND HEMATOCRIT, BLOOD
HCT: 51.8 % (ref 39.0–52.0)
Hemoglobin: 16.9 g/dL (ref 13.0–17.0)

## 2016-05-04 NOTE — Progress Notes (Addendum)
Bernell List presents today for phlebotomy per MD orders. HGB/HCT:to be drawn after phlebotomy Phlebotomy procedure started at 0742 and ended at 0751. 1 unit (18OZ) removed. Patient tolerated procedure well. IV needle removed intact. Results for SKYLEN, DEBORD (MRN [id number]) as of 05/04/2016 12:41   Ref. Range 05/04/2016 07:55  Hemoglobin Latest Ref Range: 13.0 - 17.0 g/dL 16.9  HCT    Latest Ref Range: 39.0 - 52.0 % 51.8     Office notified via voice mail that patient is in an atrial fibrillation heart rhythm with a rate of anywhere from 72-90 bpm.. The pt said that it as not normal for him. He however had no complaint of SOB or fatigue.

## 2016-06-15 ENCOUNTER — Encounter (HOSPITAL_COMMUNITY)
Admission: RE | Admit: 2016-06-15 | Discharge: 2016-06-15 | Disposition: A | Discharge status: Home / Self Care | Payer: Medicare HMO | Source: Ambulatory Visit | Attending: Urology | Admitting: Urology

## 2016-06-15 DIAGNOSIS — R799 Abnormal finding of blood chemistry, unspecified: Secondary | ICD-10-CM | POA: Diagnosis present

## 2016-06-15 LAB — HEMOGLOBIN AND HEMATOCRIT, BLOOD
HCT: 47.7 % (ref 39.0–52.0)
HEMOGLOBIN: 15.8 g/dL (ref 13.0–17.0)

## 2016-06-15 NOTE — Progress Notes (Signed)
Bernell List presents today for phlebotomy per MD orders. HGB/HCT:16.9/51.8% after last phlebotomy on 05/04/16. Phlebotomy procedure started at 0826 and ended at 0832 19.7oz removed by B. Mishue, RN Patient tolerated procedure well. IV needle removed intact from L AC.

## 2016-06-16 NOTE — Progress Notes (Signed)
Results for [name], [name] (MRN [id number]) as of 06/16/2016 11:12  Ref. Range 06/15/2016 08:36  Hemoglobin Latest Ref Range: 13.0 - 17.0 g/dL 15.8  HCT Latest Ref Range: 39.0 - 52.0 % 47.7

## 2016-07-27 ENCOUNTER — Encounter (HOSPITAL_COMMUNITY): Admission: RE | Admit: 2016-07-27 | Payer: Medicare HMO | Source: Ambulatory Visit

## 2016-07-27 ENCOUNTER — Encounter (HOSPITAL_COMMUNITY): Payer: Medicare HMO

## 2018-08-10 DEATH — deceased
